# Patient Record
Sex: Female | Born: 1965 | Race: White | Hispanic: No | Marital: Married | Smoking: Current every day smoker
Health system: Southern US, Community
[De-identification: ages and names within clinical notes are randomized; demographics above are authoritative.]

## PROBLEM LIST (undated history)

## (undated) DIAGNOSIS — F32A Depression, unspecified: Secondary | ICD-10-CM

## (undated) DIAGNOSIS — E0500 Thyrotoxicosis with diffuse goiter without thyrotoxic crisis or storm: Secondary | ICD-10-CM

## (undated) DIAGNOSIS — M25852 Other specified joint disorders, left hip: Secondary | ICD-10-CM

## (undated) DIAGNOSIS — M25851 Other specified joint disorders, right hip: Secondary | ICD-10-CM

## (undated) DIAGNOSIS — I509 Heart failure, unspecified: Secondary | ICD-10-CM

## (undated) DIAGNOSIS — Z8719 Personal history of other diseases of the digestive system: Secondary | ICD-10-CM

## (undated) DIAGNOSIS — M1990 Unspecified osteoarthritis, unspecified site: Secondary | ICD-10-CM

## (undated) DIAGNOSIS — I10 Essential (primary) hypertension: Secondary | ICD-10-CM

## (undated) DIAGNOSIS — E785 Hyperlipidemia, unspecified: Secondary | ICD-10-CM

## (undated) DIAGNOSIS — E079 Disorder of thyroid, unspecified: Secondary | ICD-10-CM

## (undated) HISTORY — DX: Disorder of thyroid, unspecified: E07.9

## (undated) HISTORY — DX: Depression, unspecified: F32.A

## (undated) HISTORY — PX: UPPER GASTROINTESTINAL ENDOSCOPY: SHX188

## (undated) HISTORY — DX: Unspecified osteoarthritis, unspecified site: M19.90

## (undated) HISTORY — PX: APPENDECTOMY: SHX54

## (undated) HISTORY — DX: Hyperlipidemia, unspecified: E78.5

## (undated) HISTORY — PX: SPINAL FUSION: SHX223

## (undated) HISTORY — PX: CHOLECYSTECTOMY: SHX55

## (undated) HISTORY — PX: ROTATOR CUFF REPAIR: SHX139

## (undated) HISTORY — DX: Other specified joint disorders, right hip: M25.852

## (undated) HISTORY — PX: COLON SURGERY: SHX602

## (undated) HISTORY — PX: HIP SURGERY: SHX245

## (undated) HISTORY — PX: COLONOSCOPY: SHX174

## (undated) HISTORY — DX: Other specified joint disorders, right hip: M25.851

## (undated) HISTORY — DX: Personal history of other diseases of the digestive system: Z87.19

---

## 2020-05-27 ENCOUNTER — Ambulatory Visit: Payer: Self-pay | Admitting: Internal Medicine

## 2020-06-03 ENCOUNTER — Ambulatory Visit: Payer: Managed Care, Other (non HMO) | Admitting: Physician Assistant

## 2020-06-03 ENCOUNTER — Encounter: Payer: Self-pay | Admitting: Physician Assistant

## 2020-06-03 ENCOUNTER — Other Ambulatory Visit: Payer: Self-pay

## 2020-06-03 DIAGNOSIS — F411 Generalized anxiety disorder: Secondary | ICD-10-CM | POA: Diagnosis not present

## 2020-06-03 DIAGNOSIS — R030 Elevated blood-pressure reading, without diagnosis of hypertension: Secondary | ICD-10-CM

## 2020-06-03 DIAGNOSIS — E039 Hypothyroidism, unspecified: Secondary | ICD-10-CM | POA: Diagnosis not present

## 2020-06-03 DIAGNOSIS — Z7689 Persons encountering health services in other specified circumstances: Secondary | ICD-10-CM

## 2020-06-03 DIAGNOSIS — R5383 Other fatigue: Secondary | ICD-10-CM

## 2020-06-03 DIAGNOSIS — K257 Chronic gastric ulcer without hemorrhage or perforation: Secondary | ICD-10-CM

## 2020-06-03 DIAGNOSIS — M159 Polyosteoarthritis, unspecified: Secondary | ICD-10-CM

## 2020-06-03 NOTE — Progress Notes (Signed)
[facility] [place], [place] [address]  Internal MEDICINE  Office Visit Note  Patient Name: Deanna Howard  [id number]  [id number]  Date of Service: 06/11/2020   Complaints/HPI Pt is here for establishment of PCP. Chief Complaint  Patient presents with  . New Patient (Initial Visit)    Establish care   HPI Pt is here to establish care, she is on thyroid meds and needs refill and lab work. -She has not seen gyn for pap or mammo in 3 years. She wants to get back with obgyn due to her hx of abnormal paps and ablation in past and to have them take care of mammogram as well. -She mentions she has had wt gain in her mid section. Saw someone last Jan 2020 and did hormone pellets and started putting on wt. Pellets were testosterone. At this time estrogen level were normal. She had a dose in March 2020 and June 2020. She was told her testosterone was low and since she had chronic pain from arthritis and chronic fatigue they moved forward with treatment options and started the pellets along with progesterone and thyroid medication. She reports she actually lost wt overall, but put on fat in midsection. After the second pellet she stopped, and also stopped progesterone. She did continue the thyroid med still. -Moved from [place] in May 2020. 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 -Takes prozac 20mg  (1/2 tab daily). She lost her husband 8 yrs ago and had some anxiety after. Her son is deployed and occasional takes xanax once per month. She still has these meds. -She was started on armour thyroid in June 2020 and then didn't go back to that office again. She stopped for awhile because of wt gain and not feeling good. Started back on thyroid 1 month ago. Has not seen anyone or had her levels checked. -Carafate: Has an ulcer and can tell when it acts up and when she takes this for a few days it calms it  back down. It flares and is a problem every 3-4 weeks. Saw GI back home and did EGD/colonoscopy. EGD showed ulcer and colonscopy was normal. Neg for H pylori. Last saw GI in 2018. She avoids NSAIDs. Does take tylenol for chronic OA. Previously took celebrex but able to get off of it.  -No true menstrual cycle in 2 years. She did have break through bleeding when on progesterone, but has not had this 8 months. -No hx of high BP. Initially very elevated in office, improved somewhat on recheck to BP 158/86. She is able to check it multiple times per day and reports it is normally fine. She will continue to monitor and log daily.  Current Medication: Outpatient Encounter Medications as of 06/03/2020  Medication Sig  . ALPRAZolam (XANAX) 0.25 MG tablet Take 0.25 mg by mouth at bedtime as needed for anxiety. Takes as needed for anxiety  . FLUoxetine (PROZAC) 10 MG tablet Take 10 mg by mouth daily.  Marland Kitchen omeprazole (PRILOSEC) 20 MG capsule Take 20 mg by mouth daily.  . sucralfate (CARAFATE) 1 g tablet Take 1 g by mouth 4 (four) times daily. Takes 4 times daily when needed  . thyroid (ARMOUR) 15 MG tablet Take 45 mg by mouth daily. Takes 3 tablets daily  . TURMERIC PO Take 1,000 mg by mouth. Takes two to three a day   No facility-administered encounter medications on  file as of 06/03/2020.    Surgical History: Past Surgical History:  Procedure Laterality Date  . APPENDECTOMY    . CHOLECYSTECTOMY    . 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 HIP SURGERY     cartiledge repair, shaving of the bone.  Marland Kitchen ROTATOR CUFF REPAIR    . SPINAL FUSION      Medical History: Past Medical History:  Diagnosis Date  . Depression   . History of duodenal ulcer   . Hyperlipidemia   . Osteoarthritis   . Thyroid disease     Family History: Family History  Problem Relation Age of Onset  . Neuropathy Mother   . Hypertension Mother   . Heart disease Father   . Kidney disease Father   . Hypertension Father    . Heart attack Father   . COPD Father   . Hypertension Brother   . Heart disease Brother   . Heart disease Brother   . Hypertension Brother   . Heart disease Brother   . Hypertension Brother     Social History   Socioeconomic History  . Marital status: Single    Spouse name: Not on file  . Number of children: Not on file  . Years of education: Not on file  . Highest education level: Not on file  Occupational History  . Not on file  Tobacco Use  . Smoking status: Current Every Day Smoker    Types: Cigarettes  . Smokeless tobacco: Never Used  Substance and Sexual Activity  . Alcohol use: Not on file  . Drug use: Not on file  . Sexual activity: Not on file  Other Topics Concern  . Not on file  Social History Narrative  . Not on file   Social Determinants of Health   Financial Resource Strain: Not on file  Food Insecurity: Not on file  Transportation Needs: Not on file  Physical Activity: Not on file  Stress: Not on file  Social Connections: Not on file  Intimate Partner Violence: Not on file     Review of Systems  Constitutional: Positive for unexpected weight change. Negative for chills and fatigue.  HENT: Negative for congestion, postnasal drip, rhinorrhea, sneezing and sore throat.   Eyes: Negative for redness.  Respiratory: Negative for cough, chest tightness and shortness of breath.   Cardiovascular: Negative for chest pain and palpitations.  Gastrointestinal: Negative for abdominal pain, constipation, diarrhea, nausea and vomiting.  Genitourinary: Negative for dysuria and frequency.  Musculoskeletal: Positive for arthralgias. Negative for back pain, joint swelling and neck pain.  Skin: Negative for rash.  Neurological: Negative.  Negative for tremors and numbness.  Hematological: Negative for adenopathy. Does not bruise/bleed easily.  Psychiatric/Behavioral: Negative for behavioral problems (Depression), sleep disturbance and suicidal ideas. The patient  is nervous/anxious.     Vital Signs: BP (!) 170/90   Pulse 94   Temp 98.7 F (37.1 C)   Resp 16   Ht 5\' 5"  (1.651 m)   Wt 186 lb 9.6 oz (84.6 kg)   SpO2 97%   BMI 31.05 kg/m    Physical Exam Vitals and nursing note reviewed.  Constitutional:      General: She is not in acute distress.    Appearance: She is well-developed. She is obese. She is not diaphoretic.  HENT:     Head: Normocephalic and atraumatic.     Mouth/Throat:     Pharynx: No oropharyngeal exudate.  Eyes:  Pupils: Pupils are equal, round, and reactive to light.  Neck:     Thyroid: No thyromegaly.     Vascular: No JVD.     Trachea: No tracheal deviation.  Cardiovascular:     Rate and Rhythm: Normal rate and regular rhythm.     Heart sounds: Normal heart sounds. No murmur heard. No friction rub. No gallop.   Pulmonary:     Effort: Pulmonary effort is normal. No respiratory distress.     Breath sounds: No wheezing or rales.  Chest:     Chest wall: No tenderness.  Abdominal:     General: Bowel sounds are normal.     Palpations: Abdomen is soft.  Musculoskeletal:        General: Normal range of motion.     Cervical back: Normal range of motion and neck supple.  Lymphadenopathy:     Cervical: No cervical adenopathy.  Skin:    General: Skin is warm and dry.  Neurological:     Mental Status: She is alert and oriented to person, place, and time.     Cranial Nerves: No cranial nerve deficit.  Psychiatric:        Behavior: Behavior normal.        Thought Content: Thought content normal.        Judgment: Judgment normal.       Assessment/Plan: 1. Encounter to establish care with new doctor Will order routine fasting labs and hormone levels to be checked prior to physical. Pt will also be referred to OBGYN per her request and report of hx of abnormal pap and ablation. She will have them set her up for mammogram as well. She states she is up to date on colonoscopy done in 2018. - Ambulatory referral  to Obstetrics / Gynecology - CBC with Differential/Platelet; Future - Lipid Panel With LDL/HDL Ratio; Future - Comprehensive metabolic panel - [id number] - FSH/LH - Insulin, Free and Total - Lipid Panel With LDL/HDL Ratio - CBC with Differential/Platelet  2. Hypothyroidism, unspecified type Continue current medication. Will check labs and adjust as needed.  3. GAD (generalized anxiety disorder) Stable. Continue Prozac daily and only use xanax as needed--Per pt this is maybe once per month.  4. Elevated BP without diagnosis of hypertension BP improved, but was still elevated on recheck. Pt will monitor closely and bring log next visit. She will call the office sooner if she is consistently getting readings higher than 140/90 at home and understands that she will need to start on medication then if so. She is resistant to any BP meds at this time.  5. Generalized osteoarthritis of multiple sites Per pt she was able to get off of celebrex, and uses tumeric instead and tylenol as needed.  6. 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 Has occasional flares about monthly where she will take carafate as needed and this resolves. She continues to take prilosec daily. May need GI referral in future.   7. Other fatigue - Ambulatory referral to Obstetrics / Gynecology - CBC with Differential/Platelet; Future - Lipid Panel With LDL/HDL Ratio; Future - Comprehensive metabolic panel - [id number] - FSH/LH - Insulin, Free and Total - Lipid Panel With LDL/HDL Ratio - CBC with Differential/Platelet   General Counseling: Eladia verbalizes understanding of the findings of todays visit and agrees with plan of treatment. I have discussed any further diagnostic evaluation that may be needed or ordered today. We also reviewed her medications today. she  has been encouraged to call the office with any questions or concerns that should arise related to  todays visit.    Counseling:    Orders Placed This Encounter  Procedures  . CBC with Differential/Platelet  . Lipid Panel With LDL/HDL Ratio  . Comprehensive metabolic panel  . TSH+T4F+T3Free  . FSH/LH  . Insulin, Free and Total  . Ambulatory referral to Obstetrics / Gynecology    No orders of the defined types were placed in this encounter.    This patient was seen by Drema Dallas, PA-C in collaboration with Dr. Clayborn Bigness as a part of collaborative care agreement.   Time spent:40 Minutes

## 2020-06-11 ENCOUNTER — Encounter: Payer: Self-pay | Admitting: Physician Assistant

## 2020-07-17 LAB — LIPID PANEL WITH LDL/HDL RATIO
Cholesterol, Total: 265 mg/dL — ABNORMAL HIGH (ref 100–199)
HDL: 61 mg/dL (ref >39)
LDL Chol Calc (NIH): 185 mg/dL — ABNORMAL HIGH (ref 0–99)
LDL/HDL Ratio: 3 ratio (ref 0.0–3.2)
Triglycerides: 108 mg/dL (ref 0–149)
VLDL Cholesterol Cal: 19 mg/dL (ref 5–40)

## 2020-07-17 LAB — CBC WITH DIFFERENTIAL/PLATELET
Basophils Absolute: 0.1 10*3/uL (ref 0.0–0.2)
Basos: 1 %
EOS (ABSOLUTE): 0.2 10*3/uL (ref 0.0–0.4)
Eos: 2 %
Hematocrit: 43 % (ref 34.0–46.6)
Hemoglobin: 14.5 g/dL (ref 11.1–15.9)
Immature Grans (Abs): 0 10*3/uL (ref 0.0–0.1)
Immature Granulocytes: 0 %
Lymphocytes Absolute: 1.9 10*3/uL (ref 0.7–3.1)
Lymphs: 25 %
MCH: 30.5 pg (ref 26.6–33.0)
MCHC: 33.7 g/dL (ref 31.5–35.7)
MCV: 91 fL (ref 79–97)
Monocytes Absolute: 0.9 10*3/uL (ref 0.1–0.9)
Monocytes: 11 %
Neutrophils Absolute: 4.8 10*3/uL (ref 1.4–7.0)
Neutrophils: 61 %
Platelets: 238 10*3/uL (ref 150–450)
RBC: 4.75 x10E6/uL (ref 3.77–5.28)
RDW: 12.7 % (ref 11.7–15.4)
WBC: 7.8 10*3/uL (ref 3.4–10.8)

## 2020-07-18 ENCOUNTER — Other Ambulatory Visit: Payer: Self-pay

## 2020-07-18 ENCOUNTER — Ambulatory Visit (INDEPENDENT_AMBULATORY_CARE_PROVIDER_SITE_OTHER): Payer: 59 | Admitting: Physician Assistant

## 2020-07-18 ENCOUNTER — Encounter: Payer: Self-pay | Admitting: Physician Assistant

## 2020-07-18 DIAGNOSIS — Z0001 Encounter for general adult medical examination with abnormal findings: Secondary | ICD-10-CM

## 2020-07-18 DIAGNOSIS — R300 Dysuria: Secondary | ICD-10-CM

## 2020-07-18 DIAGNOSIS — E7800 Pure hypercholesterolemia, unspecified: Secondary | ICD-10-CM

## 2020-07-18 DIAGNOSIS — E039 Hypothyroidism, unspecified: Secondary | ICD-10-CM

## 2020-07-18 DIAGNOSIS — R030 Elevated blood-pressure reading, without diagnosis of hypertension: Secondary | ICD-10-CM | POA: Diagnosis not present

## 2020-07-18 DIAGNOSIS — F411 Generalized anxiety disorder: Secondary | ICD-10-CM

## 2020-07-18 DIAGNOSIS — M159 Polyosteoarthritis, unspecified: Secondary | ICD-10-CM

## 2020-07-18 NOTE — Patient Instructions (Signed)
High Cholesterol  High cholesterol is a condition in which the blood has high levels of a white, waxy substance similar to fat (cholesterol). The liver makes all the cholesterol that the body needs. The human body needs small amounts of cholesterol to help build cells. A person gets extra or excess cholesterol from the food that he or she eats. The blood carries cholesterol from the liver to the rest of the body. If you have high cholesterol, deposits (plaques) may build up on the walls of your arteries. Arteries are the blood vessels that carry blood away from your heart. These plaques make the arteries narrow and stiff. Cholesterol plaques increase your risk for heart attack and stroke. Work with your health care provider to keep your cholesterol levels in a healthy range. What increases the risk? The following factors may make you more likely to develop this condition:  Eating foods that are high in animal fat (saturated fat) or cholesterol.  Being overweight.  Not getting enough exercise.  A family history of high cholesterol (familial hypercholesterolemia).  Use of tobacco products.  Having diabetes. What are the signs or symptoms? There are no symptoms of this condition. How is this diagnosed? This condition may be diagnosed based on the results of a blood test.  If you are older than 57 years of age, your health care provider may check your cholesterol levels every 4-6 years.  You may be checked more often if you have high cholesterol or other risk factors for heart disease. The blood test for cholesterol measures:  "Bad" cholesterol, or LDL cholesterol. This is the main type of cholesterol that causes heart disease. The desired level is less than 100 mg/dL.  "Good" cholesterol, or HDL cholesterol. HDL helps protect against heart disease by cleaning the arteries and carrying the LDL to the liver for processing. The desired level for HDL is 60 mg/dL or higher.  Triglycerides.  These are fats that your body can store or burn for energy. The desired level is less than 150 mg/dL.  Total cholesterol. This measures the total amount of cholesterol in your blood and includes LDL, HDL, and triglycerides. The desired level is less than 200 mg/dL. How is this treated? This condition may be treated with:  Diet changes. You may be asked to eat foods that have more fiber and less saturated fats or added sugar.  Lifestyle changes. These may include regular exercise, maintaining a healthy weight, and quitting use of tobacco products.  Medicines. These are given when diet and lifestyle changes have not worked. You may be prescribed a statin medicine to help lower your cholesterol levels. Follow these instructions at home: Eating and drinking  Eat a healthy, balanced diet. This diet includes: ? Daily servings of a variety of fresh, frozen, or canned fruits and vegetables. ? Daily servings of whole grain foods that are rich in fiber. ? Foods that are low in saturated fats and trans fats. These include poultry and fish without skin, lean cuts of meat, and low-fat dairy products. ? A variety of fish, especially oily fish that contain omega-3 fatty acids. Aim to eat fish at least 2 times a week.  Avoid foods and drinks that have added sugar.  Use healthy cooking methods, such as roasting, grilling, broiling, baking, poaching, steaming, and stir-frying. Do not fry your food except for stir-frying.   Lifestyle  Get regular exercise. Aim to exercise for a total of 150 minutes a week. Increase your activity level by doing activities   such as gardening, walking, and taking the stairs.  Do not use any products that contain nicotine or tobacco, such as cigarettes, e-cigarettes, and chewing tobacco. If you need help quitting, ask your health care provider.   General instructions  Take over-the-counter and prescription medicines only as told by your health care provider.  Keep all  follow-up visits as told by your health care provider. This is important. Where to find more information  American Heart Association: www.heart.org  National Heart, Lung, and Blood Institute: www.nhlbi.nih.gov Contact a health care provider if:  You have trouble achieving or maintaining a healthy diet or weight.  You are starting an exercise program.  You are unable to stop smoking. Get help right away if:  You have chest pain.  You have trouble breathing.  You have any symptoms of a stroke. "BE FAST" is an easy way to remember the main warning signs of a stroke: ? B - Balance. Signs are dizziness, sudden trouble walking, or loss of balance. ? E - Eyes. Signs are trouble seeing or a sudden change in vision. ? F - Face. Signs are sudden weakness or numbness of the face, or the face or eyelid drooping on one side. ? A - Arms. Signs are weakness or numbness in an arm. This happens suddenly and usually on one side of the body. ? S - Speech. Signs are sudden trouble speaking, slurred speech, or trouble understanding what people say. ? T - Time. Time to call emergency services. Write down what time symptoms started.  You have other signs of a stroke, such as: ? A sudden, severe headache with no known cause. ? Nausea or vomiting. ? Seizure. These symptoms may represent a serious problem that is an emergency. Do not wait to see if the symptoms will go away. Get medical help right away. Call your local emergency services (911 in the [place] Do not drive yourself to the hospital. Summary  Cholesterol plaques increase your risk for heart attack and stroke. Work with your health care provider to keep your cholesterol levels in a healthy range.  Eat a healthy, balanced diet, get regular exercise, and maintain a healthy weight.  Do not use any products that contain nicotine or tobacco, such as cigarettes, e-cigarettes, and chewing tobacco.  Get help right away if you have any symptoms of a  stroke. This information is not intended to replace advice given to you by your health care provider. Make sure you discuss any questions you have with your health care provider. Document Revised: 02/13/2019 Document Reviewed: 02/13/2019 Elsevier Patient Education  2021 [employer]  

## 2020-07-18 NOTE — Progress Notes (Signed)
[facility] [place], [place] [address]  Internal MEDICINE  Office Visit Note  Patient Name: Deanna Howard  [id number]  [id number]  Date of Service: 07/18/2020  Chief Complaint  Patient presents with  . Annual Exam  . Depression  . Hyperlipidemia     HPI Pt is here for routine health maintenance examination -OBGYN appt in May and will have them do her pap and order mammogram.  -UTD on colonoscopy -BP at home 140/70-80s at the highest; is changing her diet now that she is working from home. She had been doing low carb diet, but it was high in eggs, bacon and sodium content. Discussed her cholesterol was very high and she should be placed on a statin. She is against starting any BP meds or a statin right now and wants to change diet/exercise firtst and re check lipids in 6 months. She will monitoring BP closely and bring a log next visit. She does get a little anxious in office and may be why BP high in office. Recheck 154/84 -takes xanax only as needed has not taken it in over a month. -Prozac keeps her stable and mentions her son is back from deployment and is helping. -For some reason not all of her labs were drawn, so she will be sent a lab slip for any remaining labs that need to be done prior to next visit  Current Medication: Outpatient Encounter Medications as of 07/18/2020  Medication Sig  . ALPRAZolam (XANAX) 0.25 MG tablet Take 0.25 mg by mouth at bedtime as needed for anxiety. Takes as needed for anxiety  . FLUoxetine (PROZAC) 10 MG tablet Take 10 mg by mouth daily.  Marland Kitchen omeprazole (PRILOSEC) 20 MG capsule Take 20 mg by mouth daily.  . sucralfate (CARAFATE) 1 g tablet Take 1 g by mouth 4 (four) times daily. Takes 4 times daily when needed  . thyroid (ARMOUR) 15 MG tablet Take 45 mg by mouth daily. Takes 3 tablets daily  . TURMERIC PO Take 1,000 mg by mouth. Takes two to three a day   No facility-administered encounter medications on file as of  07/18/2020.    Surgical History: Past Surgical History:  Procedure Laterality Date  . APPENDECTOMY    . CHOLECYSTECTOMY    . 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 HIP SURGERY     cartiledge repair, shaving of the bone.  Marland Kitchen ROTATOR CUFF REPAIR    . SPINAL FUSION      Medical History: Past Medical History:  Diagnosis Date  . Depression   . History of duodenal ulcer   . Hyperlipidemia   . Osteoarthritis   . Thyroid disease     Family History: Family History  Problem Relation Age of Onset  . Neuropathy Mother   . Hypertension Mother   . Heart disease Father   . Kidney disease Father   . Hypertension Father   . Heart attack Father   . COPD Father   . Hypertension Brother   . Heart disease Brother   . Heart disease Brother   . Hypertension Brother   . Heart disease Brother   . Hypertension Brother       Review of Systems  Constitutional: Negative for chills, fatigue and unexpected weight change.  HENT: Negative for congestion, postnasal drip, rhinorrhea, sneezing and sore throat.   Eyes: Negative for redness.  Respiratory: Negative for cough, chest tightness and shortness of breath.   Cardiovascular: Negative for  chest pain and palpitations.  Gastrointestinal: Negative for abdominal pain, constipation, diarrhea, nausea and vomiting.  Genitourinary: Negative for dysuria and frequency.  Musculoskeletal: Negative for arthralgias, back pain, joint swelling and neck pain.  Skin: Negative for rash.  Neurological: Negative.  Negative for tremors and numbness.  Hematological: Negative for adenopathy. Does not bruise/bleed easily.  Psychiatric/Behavioral: Negative for behavioral problems (Depression), sleep disturbance and suicidal ideas. The patient is nervous/anxious.      Vital Signs: BP (!) 160/90   Pulse 82   Temp 97.9 F (36.6 C)   Resp 16   Ht 5\' 5"  (1.651 m)   Wt 185 lb (83.9 kg)   SpO2 99%   BMI 30.79 kg/m    Physical Exam Vitals and nursing  note reviewed.  Constitutional:      General: She is not in acute distress.    Appearance: She is well-developed. She is obese. She is not diaphoretic.  HENT:     Head: Normocephalic and atraumatic.     Right Ear: External ear normal.     Left Ear: External ear normal.     Nose: Nose normal.     Mouth/Throat:     Pharynx: No oropharyngeal exudate.  Eyes:     General: No scleral icterus.       Right eye: No discharge.        Left eye: No discharge.     Conjunctiva/sclera: Conjunctivae normal.     Pupils: Pupils are equal, round, and reactive to light.  Neck:     Thyroid: No thyromegaly.     Vascular: No JVD.     Trachea: No tracheal deviation.  Cardiovascular:     Rate and Rhythm: Normal rate and regular rhythm.     Heart sounds: Normal heart sounds. No murmur heard. No friction rub. No gallop.   Pulmonary:     Effort: Pulmonary effort is normal. No respiratory distress.     Breath sounds: Normal breath sounds. No stridor. No wheezing or rales.  Chest:     Chest wall: No tenderness.  Breasts:     Right: Normal. No mass.     Left: Normal. No mass.    Abdominal:     General: Bowel sounds are normal. There is no distension.     Palpations: Abdomen is soft. There is no mass.     Tenderness: There is no abdominal tenderness. There is no guarding or rebound.  Musculoskeletal:        General: No tenderness or deformity. Normal range of motion.     Cervical back: Normal range of motion and neck supple.  Lymphadenopathy:     Cervical: No cervical adenopathy.  Skin:    General: Skin is warm and dry.     Coloration: Skin is not pale.     Findings: No erythema or rash.  Neurological:     Mental Status: She is alert and oriented to person, place, and time.     Cranial Nerves: No cranial nerve deficit.     Motor: No abnormal muscle tone.     Coordination: Coordination normal.     Deep Tendon Reflexes: Reflexes are normal and symmetric.  Psychiatric:        Behavior: Behavior  normal.        Thought Content: Thought content normal.        Judgment: Judgment normal.      LABS: Recent Results (from the past 2160 hour(s))  Lipid Panel With LDL/HDL Ratio  Status: Abnormal   Collection Time: 07/16/20  7:15 AM  Result Value Ref Range   Cholesterol, Total 265 (H) 100 - 199 mg/dL   Triglycerides 108 0 - 149 mg/dL   HDL 61 >39 mg/dL   VLDL Cholesterol Cal 19 5 - 40 mg/dL   LDL Chol Calc (NIH) 185 (H) 0 - 99 mg/dL   LDL/HDL Ratio 3.0 0.0 - 3.2 ratio    Comment:                                     LDL/HDL Ratio                                             Men  Women                               1/2 Avg.Risk  1.0    1.5                                   Avg.Risk  3.6    3.2                                2X Avg.Risk  6.2    5.0                                3X Avg.Risk  8.0    6.1   CBC with Differential/Platelet     Status: None   Collection Time: 07/16/20  7:16 AM  Result Value Ref Range   WBC 7.8 3.4 - 10.8 x10E3/uL   RBC 4.75 3.77 - 5.28 x10E6/uL   Hemoglobin 14.5 11.1 - 15.9 g/dL   Hematocrit 43.0 34.0 - 46.6 %   MCV 91 79 - 97 fL   MCH 30.5 26.6 - 33.0 pg   MCHC 33.7 31.5 - 35.7 g/dL   RDW 12.7 11.7 - 15.4 %   Platelets 238 150 - 450 x10E3/uL   Neutrophils 61 Not Estab. %   Lymphs 25 Not Estab. %   Monocytes 11 Not Estab. %   Eos 2 Not Estab. %   Basos 1 Not Estab. %   Neutrophils Absolute 4.8 1.4 - 7.0 x10E3/uL   Lymphocytes Absolute 1.9 0.7 - 3.1 x10E3/uL   Monocytes Absolute 0.9 0.1 - 0.9 x10E3/uL   EOS (ABSOLUTE) 0.2 0.0 - 0.4 x10E3/uL   Basophils Absolute 0.1 0.0 - 0.2 x10E3/uL   Immature Granulocytes 0 Not Estab. %   Immature Grans (Abs) 0.0 0.0 - 0.1 x10E3/uL        Assessment/Plan: 1. Encounter for general adult medical examination with abnormal findings Pt had some of her labs drawn and will be mailed a slip for remaining labs. She will have pap and mammogram done via OBGYN in May. UTD on colonoscopy  2. Hypothyroidism,  unspecified type Pending labs, continue armour thyroid and adjust as indicated  3. GAD (generalized anxiety disorder) Stable, Continue Prozac  4. Elevated BP without diagnosis of hypertension BP improved some on recheck. Per pt never over 140/80  at home. She will log BP at home for next visit. She is also working on diet/exercise.  5. Pure hypercholesterolemia Pt declined statin at this time, making effort to change diet and increase exercise.  6. Generalized osteoarthritis of multiple sites May take tylenol as needed.  7. Dysuria - UA/M w/rflx Culture, Routine   General Counseling: Deanna Howard verbalizes understanding of the findings of todays visit and agrees with plan of treatment. I have discussed any further diagnostic evaluation that may be needed or ordered today. We also reviewed her medications today. she has been encouraged to call the office with any questions or concerns that should arise related to todays visit.    Counseling:    Orders Placed This Encounter  Procedures  . UA/M w/rflx Culture, Routine    No orders of the defined types were placed in this encounter.   This patient was seen by Drema Dallas, PA-C in collaboration with Dr. Clayborn Bigness as a part of collaborative care agreement.  Total time spent:30 Minutes  Time spent includes review of chart, medications, test results, and follow up plan with the patient.     Lavera Guise, MD  Internal Medicine

## 2020-07-19 LAB — TSH+T4F+T3FREE
Free T4: 1.11 ng/dL (ref 0.82–1.77)
T3, Free: 3.1 pg/mL (ref 2.0–4.4)
TSH: 1 u[IU]/mL (ref 0.450–4.500)

## 2020-07-19 LAB — SPECIMEN STATUS REPORT

## 2020-07-21 LAB — MICROSCOPIC EXAMINATION
Casts: NONE SEEN /lpf
Epithelial Cells (non renal): >10 /hpf — AB (ref 0–10)
RBC, Urine: NONE SEEN /hpf (ref 0–2)

## 2020-07-21 LAB — UA/M W/RFLX CULTURE, ROUTINE
Bilirubin, UA: NEGATIVE
Glucose, UA: NEGATIVE
Ketones, UA: NEGATIVE
Nitrite, UA: NEGATIVE
Protein,UA: NEGATIVE
RBC, UA: NEGATIVE
Specific Gravity, UA: 1.009 (ref 1.005–1.030)
Urobilinogen, Ur: 0.2 mg/dL (ref 0.2–1.0)
pH, UA: 7 (ref 5.0–7.5)

## 2020-07-21 LAB — URINE CULTURE, REFLEX

## 2020-07-26 ENCOUNTER — Encounter: Payer: Self-pay | Admitting: Internal Medicine

## 2020-07-26 ENCOUNTER — Other Ambulatory Visit: Payer: Self-pay | Admitting: Physician Assistant

## 2020-07-26 DIAGNOSIS — R5383 Other fatigue: Secondary | ICD-10-CM

## 2020-08-11 LAB — COMPREHENSIVE METABOLIC PANEL
ALT: 25 IU/L (ref 0–32)
AST: 17 IU/L (ref 0–40)
Albumin/Globulin Ratio: 1.9 (ref 1.2–2.2)
Albumin: 4.4 g/dL (ref 3.8–4.9)
Alkaline Phosphatase: 83 IU/L (ref 44–121)
BUN/Creatinine Ratio: 18 (ref 9–23)
BUN: 14 mg/dL (ref 6–24)
Bilirubin Total: 0.3 mg/dL (ref 0.0–1.2)
CO2: 23 mmol/L (ref 20–29)
Calcium: 9.4 mg/dL (ref 8.7–10.2)
Chloride: 100 mmol/L (ref 96–106)
Creatinine, Ser: 0.77 mg/dL (ref 0.57–1.00)
Globulin, Total: 2.3 g/dL (ref 1.5–4.5)
Glucose: 96 mg/dL (ref 65–99)
Potassium: 3.7 mmol/L (ref 3.5–5.2)
Sodium: 138 mmol/L (ref 134–144)
Total Protein: 6.7 g/dL (ref 6.0–8.5)
eGFR: 90 mL/min/{1.73_m2} (ref >59)

## 2020-08-11 LAB — FSH/LH
FSH: 63.9 m[IU]/mL
LH: 47.7 m[IU]/mL

## 2020-08-11 LAB — INSULIN, FREE AND TOTAL
Free Insulin: 8.2 uU/mL
Total Insulin: 8.2 uU/mL

## 2020-08-15 ENCOUNTER — Encounter: Payer: Self-pay | Admitting: Physician Assistant

## 2020-08-15 ENCOUNTER — Other Ambulatory Visit: Payer: Self-pay | Admitting: Physician Assistant

## 2020-08-15 DIAGNOSIS — I10 Essential (primary) hypertension: Secondary | ICD-10-CM

## 2020-08-15 MED ORDER — LOSARTAN POTASSIUM 25 MG PO TABS: 25.0000 mg | ORAL_TABLET | Freq: Every day | ORAL | 0 refills | Status: DC

## 2020-08-15 NOTE — Telephone Encounter (Signed)
Try losartan 25mg  po qd

## 2020-08-21 ENCOUNTER — Ambulatory Visit (INDEPENDENT_AMBULATORY_CARE_PROVIDER_SITE_OTHER): Payer: 59 | Admitting: Certified Nurse Midwife

## 2020-08-21 ENCOUNTER — Encounter: Payer: Self-pay | Admitting: Certified Nurse Midwife

## 2020-08-21 ENCOUNTER — Other Ambulatory Visit: Payer: Self-pay

## 2020-08-21 ENCOUNTER — Other Ambulatory Visit (HOSPITAL_COMMUNITY)
Admission: RE | Admit: 2020-08-21 | Discharge: 2020-08-21 | Disposition: A | Discharge status: Home / Self Care | Payer: 59 | Source: Ambulatory Visit | Attending: Certified Nurse Midwife | Admitting: Certified Nurse Midwife

## 2020-08-21 VITALS — BP 172/99 | HR 76 | Resp 16 | Ht 65.0 in | Wt 188.6 lb

## 2020-08-21 DIAGNOSIS — Z124 Encounter for screening for malignant neoplasm of cervix: Secondary | ICD-10-CM | POA: Diagnosis not present

## 2020-08-21 DIAGNOSIS — Z1231 Encounter for screening mammogram for malignant neoplasm of breast: Secondary | ICD-10-CM

## 2020-08-21 DIAGNOSIS — R140 Abdominal distension (gaseous): Secondary | ICD-10-CM | POA: Diagnosis not present

## 2020-08-21 DIAGNOSIS — Z01419 Encounter for gynecological examination (general) (routine) without abnormal findings: Secondary | ICD-10-CM

## 2020-08-21 DIAGNOSIS — Z1159 Encounter for screening for other viral diseases: Secondary | ICD-10-CM

## 2020-08-21 DIAGNOSIS — Z23 Encounter for immunization: Secondary | ICD-10-CM

## 2020-08-21 DIAGNOSIS — Z114 Encounter for screening for human immunodeficiency virus [HIV]: Secondary | ICD-10-CM

## 2020-08-21 NOTE — Progress Notes (Signed)
GYNECOLOGY ANNUAL PREVENTATIVE CARE ENCOUNTER NOTE  History:     Deanna Howard is a 54 y.o. G6P2002 female here for a routine annual gynecologic exam.  Current complaints: Abdominal bloating that makes her very uncomfortable. State it does not mater what she eats.   Denies abnormal vaginal bleeding, discharge, pelvic pain, problems with intercourse or other gynecologic concerns.     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 Patient is postmenopausal. Contraception: tubal ligation Last Pap: 2019 Results were: normal  Last mammogram: 2019. Results were: normal  Obstetric History OB History  Gravida Para Term Preterm AB Living  2 2 2  0 0 2  SAB IAB Ectopic Multiple Live Births  0 0 0 0 0    # Outcome Date GA Lbr Len/2nd Weight Sex Delivery Anes PTL Lv  2 Term           1 Term             Past Medical History:  Diagnosis Date  . Depression   . History of duodenal ulcer   . Hyperlipidemia   . Osteoarthritis   . Thyroid disease     Past Surgical History:  Procedure Laterality Date  . APPENDECTOMY    . CHOLECYSTECTOMY    . 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 HIP SURGERY     cartiledge repair, shaving of the bone.  Marland Kitchen ROTATOR CUFF REPAIR    . SPINAL FUSION      Current Outpatient Medications on File Prior to Visit  Medication Sig Dispense Refill  . ALPRAZolam (XANAX) 0.25 MG tablet Take 0.25 mg by mouth at bedtime as needed for anxiety. Takes as needed for anxiety    . FLUoxetine (PROZAC) 10 MG tablet Take 10 mg by mouth daily.    Marland Kitchen losartan (COZAAR) 25 MG tablet Take 1 tablet (25 mg total) by mouth daily. 90 tablet 0  . omeprazole (PRILOSEC) 20 MG capsule Take 20 mg by mouth daily.    . sucralfate  (CARAFATE) 1 g tablet Take 1 g by mouth 4 (four) times daily. Takes 4 times daily when needed    . thyroid (ARMOUR) 15 MG tablet Take 45 mg by mouth daily. Takes 3 tablets daily    . TURMERIC PO Take 1,000 mg by mouth. Takes two to three a day     No current facility-administered medications on file prior to visit.    No Known Allergies  Social History:  reports that she has been smoking cigarettes. She has been smoking about 1.00 pack per day. She has never used smokeless tobacco.  Family History  Problem Relation Age of Onset  . Neuropathy Mother   . Hypertension Mother   . Heart disease Father   . Kidney disease Father   . Hypertension Father   . Heart attack Father   . COPD Father   . Hypertension Brother   . Heart disease Brother   . Heart disease Brother   . Hypertension Brother   . Heart disease Brother   . Hypertension Brother     The following portions of the patient's history were reviewed and updated as appropriate: allergies, current medications, past family history, past medical history, past social history, past  surgical history and problem list.  Review of Systems Pertinent items noted in HPI and remainder of comprehensive ROS otherwise negative.  Physical Exam:  BP (!) 172/99   Pulse 76   Resp 16   Ht 5\' 5"  (1.651 m)   Wt 188 lb 9.6 oz (85.5 kg)   BMI 31.38 kg/m  CONSTITUTIONAL: Well-developed, well-nourished female in no acute distress.  HENT:  Normocephalic, atraumatic, External right and left ear normal. Oropharynx is clear and moist EYES: Conjunctivae and EOM are normal. Pupils are equal, round, and reactive to light. No scleral icterus.  NECK: Normal range of motion, supple, no masses.  Normal thyroid.  SKIN: Skin is warm and dry. No rash noted. Not diaphoretic. No erythema. No pallor. MUSCULOSKELETAL: Normal range of motion. No tenderness.  No cyanosis, clubbing, or edema.  2+ distal pulses. NEUROLOGIC: Alert and oriented to person, place, and  time. Normal reflexes, muscle tone coordination.  PSYCHIATRIC: Normal mood and affect. Normal behavior. Normal judgment and thought content. CARDIOVASCULAR: Normal heart rate noted, regular rhythm RESPIRATORY: Clear to auscultation bilaterally. Effort and breath sounds normal, no problems with respiration noted. BREASTS: Symmetric in size. No masses, tenderness, skin changes, nipple drainage, or lymphadenopathy bilaterally.  ABDOMEN: Soft, no distention noted.  No tenderness, rebound or guarding.  PELVIC: Normal appearing external genitalia and urethral meatus; normal appearing vaginal mucosa and cervix.  No abnormal discharge noted.  Pap smear obtained.  Normal uterine size, no other palpable masses, no uterine or adnexal tenderness.  .   Assessment and Plan:    Well Women GYN exam    Pap:Will follow up results of pap smear and manage accordingly. Mammogram :ordered Labs: declines  Refills: none Referral: GI  Routine preventative health maintenance measures emphasized. Please refer to After Visit Summary for other counseling recommendations.      Philip Aspen, CNM [facility]

## 2020-08-21 NOTE — Patient Instructions (Signed)
Preventive Care 57-57 Years Old, Female Preventive care refers to lifestyle choices and visits with your health care provider that can promote health and wellness. This includes:  A yearly physical exam. This is also called an annual wellness visit.  Regular dental and eye exams.  Immunizations.  Screening for certain conditions.  Healthy lifestyle choices, such as: ? Eating a healthy diet. ? Getting regular exercise. ? Not using drugs or products that contain nicotine and tobacco. ? Limiting alcohol use. What can I expect for my preventive care visit? Physical exam Your health care provider will check your:  Height and weight. These may be used to calculate your BMI (body mass index). BMI is a measurement that tells if you are at a healthy weight.  Heart rate and blood pressure.  Body temperature.  Skin for abnormal spots. Counseling Your health care provider may ask you questions about your:  Past medical problems.  Family's medical history.  Alcohol, tobacco, and drug use.  Emotional well-being.  Home life and relationship well-being.  Sexual activity.  Diet, exercise, and sleep habits.  Work and work Statistician.  Access to firearms.  Method of birth control.  Menstrual cycle.  Pregnancy history. What immunizations do I need? Vaccines are usually given at various ages, according to a schedule. Your health care provider will recommend vaccines for you based on your age, medical history, and lifestyle or other factors, such as travel or where you work.   What tests do I need? Blood tests  Lipid and cholesterol levels. These may be checked every 5 years, or more often if you are over 57 years old.  Hepatitis C test.  Hepatitis B test. Screening  Lung cancer screening. You may have this screening every year starting at age 57 if you have a 30-pack-year history of smoking and currently smoke or have quit within the past 15 years.  Colorectal cancer  screening. ? All adults should have this screening starting at age 57 and continuing until age 17. and continuing until age 17. ? Your health care provider may recommend screening at age 57 if you are at increased risk. ? You will have tests every 1-10 years, depending on your results and the type of screening test.  Diabetes screening. ? This is done by checking your blood sugar (glucose) after you have not eaten for a while (fasting). ? You may have this done every 1-3 years.  Mammogram. ? This may be done every 1-2 years. ? Talk with your health care provider about when you should start having regular mammograms. This may depend on whether you have a family history of breast cancer.  BRCA-related cancer screening. This may be done if you have a family history of breast, ovarian, tubal, or peritoneal cancers.  Pelvic exam and Pap test. ? This may be done every 3 years starting at age 57. ? Starting at age 11, this may be done every 5 years if you have a Pap test in combination with an HPV test. Other tests  STD (sexually transmitted disease) testing, if you are at risk.  Bone density scan. This is done to screen for osteoporosis. You may have this scan if you are at high risk for osteoporosis. Talk with your health care provider about your test results, treatment options, and if necessary, the need for more tests. Follow these instructions at home: Eating and drinking  Eat a diet that includes fresh fruits and vegetables, whole grains, lean protein, and low-fat dairy products.  Take vitamin and mineral supplements  as recommended by your health care provider.  Do not drink alcohol if: ? Your health care provider tells you not to drink. ? You are pregnant, may be pregnant, or are planning to become pregnant.  If you drink alcohol: ? Limit how much you have to 0-1 drink a day. ? Be aware of how much alcohol is in your drink. In the [place] one drink equals one 12 oz bottle of beer (355 mL), one 5 oz glass of  wine (148 mL), or one 1 oz glass of hard liquor (44 mL).   Lifestyle  Take daily care of your teeth and gums. Brush your teeth every morning and night with fluoride toothpaste. Floss one time each day.  Stay active. Exercise for at least 30 minutes 5 or more days each week.  Do not use any products that contain nicotine or tobacco, such as cigarettes, e-cigarettes, and chewing tobacco. If you need help quitting, ask your health care provider.  Do not use drugs.  If you are sexually active, practice safe sex. Use a condom or other form of protection to prevent STIs (sexually transmitted infections).  If you do not wish to become pregnant, use a form of birth control. If you plan to become pregnant, see your health care provider for a prepregnancy visit.  If told by your health care provider, take low-dose aspirin daily starting at age 50.  Find healthy ways to cope with stress, such as: ? Meditation, yoga, or listening to music. ? Journaling. ? Talking to a trusted person. ? Spending time with friends and family. Safety  Always wear your seat belt while driving or riding in a vehicle.  Do not drive: ? If you have been drinking alcohol. Do not ride with someone who has been drinking. ? When you are tired or distracted. ? While texting.  Wear a helmet and other protective equipment during sports activities.  If you have firearms in your house, make sure you follow all gun safety procedures. What's next?  Visit your health care provider once a year for an annual wellness visit.  Ask your health care provider how often you should have your eyes and teeth checked.  Stay up to date on all vaccines. This information is not intended to replace advice given to you by your health care provider. Make sure you discuss any questions you have with your health care provider. Document Revised: 12/19/2019 Document Reviewed: 11/25/2017 Elsevier Patient Education  2021 Elsevier Inc.  

## 2020-08-22 ENCOUNTER — Encounter: Payer: Self-pay | Admitting: *Deleted

## 2020-08-23 LAB — CYTOLOGY - PAP
Comment: NEGATIVE
Diagnosis: NEGATIVE
High risk HPV: NEGATIVE

## 2020-08-27 ENCOUNTER — Other Ambulatory Visit: Payer: Self-pay | Admitting: Certified Nurse Midwife

## 2020-08-27 MED ORDER — METRONIDAZOLE 500 MG PO TABS: 500.0000 mg | ORAL_TABLET | Freq: Two times a day (BID) | ORAL | 0 refills | Status: AC

## 2020-10-07 ENCOUNTER — Other Ambulatory Visit: Payer: Self-pay

## 2020-10-07 ENCOUNTER — Telehealth: Payer: Self-pay

## 2020-10-07 ENCOUNTER — Encounter: Payer: Self-pay | Admitting: Physician Assistant

## 2020-10-07 DIAGNOSIS — R030 Elevated blood-pressure reading, without diagnosis of hypertension: Secondary | ICD-10-CM

## 2020-10-07 DIAGNOSIS — Z7689 Persons encountering health services in other specified circumstances: Secondary | ICD-10-CM

## 2020-10-07 DIAGNOSIS — F411 Generalized anxiety disorder: Secondary | ICD-10-CM

## 2020-10-07 DIAGNOSIS — R5383 Other fatigue: Secondary | ICD-10-CM

## 2020-10-07 DIAGNOSIS — E039 Hypothyroidism, unspecified: Secondary | ICD-10-CM

## 2020-10-07 DIAGNOSIS — M159 Polyosteoarthritis, unspecified: Secondary | ICD-10-CM

## 2020-10-07 DIAGNOSIS — K257 Chronic gastric ulcer without hemorrhage or perforation: Secondary | ICD-10-CM

## 2020-10-07 DIAGNOSIS — I10 Essential (primary) hypertension: Secondary | ICD-10-CM

## 2020-10-07 MED ORDER — THYROID 15 MG PO TABS: 45.0000 mg | ORAL_TABLET | Freq: Every day | ORAL | 1 refills | Status: DC

## 2020-10-07 MED ORDER — LOSARTAN POTASSIUM 25 MG PO TABS: 25.0000 mg | ORAL_TABLET | Freq: Every day | ORAL | 0 refills | Status: DC

## 2020-10-07 MED ORDER — SULFAMETHOXAZOLE-TRIMETHOPRIM 800-160 MG PO TABS
1.0000 | ORAL_TABLET | Freq: Two times a day (BID) | ORAL | 0 refills | Status: DC
Start: 2020-10-07 — End: 2020-10-07

## 2020-10-07 MED ORDER — SULFAMETHOXAZOLE-TRIMETHOPRIM 800-160 MG PO TABS: 1.0000 | ORAL_TABLET | Freq: Two times a day (BID) | ORAL | 0 refills | Status: DC

## 2020-10-07 NOTE — Telephone Encounter (Signed)
Pt called that had UTI symptoms unable to come in due to new jobs as per lauren send her bactrim also refill her thyroid med confirm she is taking armour thyroid and also send losartan  and advised her keep follow up

## 2020-10-07 NOTE — Telephone Encounter (Signed)
Please review

## 2020-10-07 NOTE — Telephone Encounter (Signed)
Pt sent message thru my chart stating she may have a UTI.  Per DFK we sent in Bactrim 800 to her pharmacy.  Pt was notified and informed to keep her f-up appt.  Pt stated she would keep her appt but she has started a new position at her job and may have to change to a different date.

## 2020-10-17 ENCOUNTER — Encounter: Payer: Self-pay | Admitting: Physician Assistant

## 2020-10-17 ENCOUNTER — Ambulatory Visit (INDEPENDENT_AMBULATORY_CARE_PROVIDER_SITE_OTHER): Payer: 59 | Admitting: Physician Assistant

## 2020-10-17 ENCOUNTER — Other Ambulatory Visit: Payer: Self-pay

## 2020-10-17 DIAGNOSIS — E039 Hypothyroidism, unspecified: Secondary | ICD-10-CM

## 2020-10-17 DIAGNOSIS — E7800 Pure hypercholesterolemia, unspecified: Secondary | ICD-10-CM

## 2020-10-17 DIAGNOSIS — R300 Dysuria: Secondary | ICD-10-CM

## 2020-10-17 DIAGNOSIS — F411 Generalized anxiety disorder: Secondary | ICD-10-CM

## 2020-10-17 DIAGNOSIS — I10 Essential (primary) hypertension: Secondary | ICD-10-CM | POA: Diagnosis not present

## 2020-10-17 LAB — POCT URINALYSIS DIPSTICK
Bilirubin, UA: NEGATIVE
Glucose, UA: NEGATIVE
Ketones, UA: NEGATIVE
Leukocytes, UA: NEGATIVE
Nitrite, UA: NEGATIVE
Protein, UA: POSITIVE — AB
Spec Grav, UA: 1.01 (ref 1.010–1.025)
Urobilinogen, UA: 0.2 E.U./dL
pH, UA: 6.5 (ref 5.0–8.0)

## 2020-10-17 NOTE — Progress Notes (Signed)
[facility] [place], [place] [address]  Internal MEDICINE  Office Visit Note  Patient Name: Deanna Howard  [id number]  [id number]  Date of Service: 10/20/2020  Chief Complaint  Patient presents with   Follow-up    Discuss meds, recheck urine for UTI   Depression   Hyperlipidemia    HPI Pt is here for routine follow up -BP at home 140s/70-80, no longer having headaches or irritability since starting losartan, but still not fully controlled. Especially based on office reading. Discussed doubling to 50mg  losartan and monitoring BP closely. Will re-evaluate in 4 weeks. -Snores, does not gasp. Reports previous sleep study maybe 6 years ago that did not show OSA. Weight has changed-in the last year has put on about 15lbs due to GI issues. But GI issues have improved and is now going to work on wt loss since she is feeling better. Wants to hold off on new PSG for now as she works to lose weight and get BP under control, but willing to discuss again next visit if BP still problematic as uncontrolled bp can be a result of untreated OSA. -No longer having any UTI symptoms. Did a dip in office--some blood in urine--will repeat next visit for blood and check renal US if not improving  Current Medication: Outpatient Encounter Medications as of 10/17/2020  Medication Sig   ALPRAZolam (XANAX) 0.25 MG tablet Take 0.25 mg by mouth at bedtime as needed for anxiety. Takes as needed for anxiety   FLUoxetine (PROZAC) 10 MG tablet Take 10 mg by mouth daily.   losartan (COZAAR) 25 MG tablet Take 1 tablet (25 mg total) by mouth daily.   omeprazole (PRILOSEC) 20 MG capsule Take 20 mg by mouth daily.   sucralfate (CARAFATE) 1 g tablet Take 1 g by mouth 4 (four) times daily. Takes 4 times daily when needed   thyroid (ARMOUR) 15 MG tablet Take 3 tablets (45 mg total) by mouth daily.   TURMERIC PO Take 1,000 mg by mouth. Takes two to three a day   [DISCONTINUED]  sulfamethoxazole-trimethoprim (BACTRIM DS) 800-160 MG tablet Take 1 tablet by mouth 2 (two) times daily. For 5 days (Patient not taking: Reported on 10/17/2020)   No facility-administered encounter medications on file as of 10/17/2020.    Surgical History: Past Surgical History:  Procedure Laterality Date   APPENDECTOMY     CHOLECYSTECTOMY     COLON SURGERY     removed tumor from colon   HIP SURGERY     cartiledge repair, shaving of the bone.   ROTATOR CUFF REPAIR     SPINAL FUSION      Medical History: Past Medical History:  Diagnosis Date   Depression    Femoroacetabular impingement of both hips    History of duodenal ulcer    Hyperlipidemia    Osteoarthritis    Thyroid disease     Family History: Family History  Problem Relation Age of Onset   Neuropathy Mother    Hypertension Mother    Heart disease Father    Kidney disease Father    Hypertension Father    Heart attack Father    COPD Father    Hypertension Brother    Heart disease Brother    Heart disease Brother    Hypertension Brother    Heart disease Brother    Hypertension Brother     Social History   Socioeconomic History   Marital status: Widowed    Spouse name: Not on  file   Number of children: Not on file   Years of education: Not on file   Highest education level: Not on file  Occupational History   Not on file  Tobacco Use   Smoking status: Every Day    Packs/day: 1.00    Types: Cigarettes   Smokeless tobacco: Never  Substance and Sexual Activity   Alcohol use: Yes    Alcohol/week: 3.0 standard drinks    Types: 3 Cans of beer per week    Comment: 3-4 beers a couple times a week   Drug use: Not on file   Sexual activity: Not on file  Other Topics Concern   Not on file  Social History Narrative   Not on file   Social Determinants of Health   Financial Resource Strain: Not on file  Food Insecurity: Not on file  Transportation Needs: Not on file  Physical Activity: Not on file   Stress: Not on file  Social Connections: Not on file  Intimate Partner Violence: Not on file      Review of Systems  Constitutional:  Negative for chills, fatigue and unexpected weight change.  HENT:  Negative for congestion, postnasal drip, rhinorrhea, sneezing and sore throat.   Eyes:  Negative for redness.  Respiratory:  Negative for cough, chest tightness and shortness of breath.   Cardiovascular:  Negative for chest pain and palpitations.  Gastrointestinal:  Negative for abdominal pain, constipation, diarrhea, nausea and vomiting.  Genitourinary:  Negative for dysuria and frequency.  Musculoskeletal:  Negative for arthralgias, back pain, joint swelling and neck pain.  Skin:  Negative for rash.  Neurological: Negative.  Negative for tremors, numbness and headaches.  Hematological:  Negative for adenopathy. Does not bruise/bleed easily.  Psychiatric/Behavioral:  Negative for behavioral problems (Depression), sleep disturbance and suicidal ideas. The patient is not nervous/anxious.    Vital Signs: BP (!) 164/90   Pulse 82   Temp (!) 97.2 F (36.2 C)   Resp 16   Ht 5\' 5"  (1.651 m)   Wt 191 lb 6.4 oz (86.8 kg)   SpO2 98%   BMI 31.85 kg/m    Physical Exam Vitals and nursing note reviewed.  Constitutional:      General: She is not in acute distress.    Appearance: She is well-developed. She is obese. She is not diaphoretic.  HENT:     Head: Normocephalic and atraumatic.     Mouth/Throat:     Pharynx: No oropharyngeal exudate.  Eyes:     Pupils: Pupils are equal, round, and reactive to light.  Neck:     Thyroid: No thyromegaly.     Vascular: No JVD.     Trachea: No tracheal deviation.  Cardiovascular:     Rate and Rhythm: Normal rate and regular rhythm.     Heart sounds: Normal heart sounds. No murmur heard.   No friction rub. No gallop.  Pulmonary:     Effort: Pulmonary effort is normal. No respiratory distress.     Breath sounds: No wheezing or rales.  Chest:      Chest wall: No tenderness.  Abdominal:     General: Bowel sounds are normal.     Palpations: Abdomen is soft.  Musculoskeletal:        General: Normal range of motion.     Cervical back: Normal range of motion and neck supple.  Lymphadenopathy:     Cervical: No cervical adenopathy.  Skin:    General: Skin is warm and dry.  Neurological:     Mental Status: She is alert and oriented to person, place, and time.     Cranial Nerves: No cranial nerve deficit.  Psychiatric:        Behavior: Behavior normal.        Thought Content: Thought content normal.        Judgment: Judgment normal.       Assessment/Plan: 1. Essential hypertension Elevated in office and has been high at home as well. Will double up on losartan to 50mg  total. She will monitor closely at home. Will consider ordering PSG despite negative test in years past if not improving and if unable to lose wt.  2. Hypothyroidism, unspecified type Continue current dose  3. GAD (generalized anxiety disorder) Stable, continue prozac  4. Pure hypercholesterolemia Working on weight loss and changing diet. Will recheck labs and discuss statin again in future.   5. Dysuria - POCT Urinalysis Dipstick did show some blood in urine following recent UTI, will go ahead and send culture and recheck urine next visit   General Counseling: tokiko diefenderfer understanding of the findings of todays visit and agrees with plan of treatment. I have discussed any further diagnostic evaluation that may be needed or ordered today. We also reviewed her medications today. she has been encouraged to call the office with any questions or concerns that should arise related to todays visit.    Orders Placed This Encounter  Procedures   CULTURE, URINE COMPREHENSIVE   POCT Urinalysis Dipstick    No orders of the defined types were placed in this encounter.   This patient was seen by Drema Dallas, PA-C in collaboration with Dr. Clayborn Bigness  as a part of collaborative care agreement.   Total time spent:30 Minutes Time spent includes review of chart, medications, test results, and follow up plan with the patient.      Dr Lavera Guise Internal medicine

## 2020-10-22 LAB — CULTURE, URINE COMPREHENSIVE

## 2020-11-11 ENCOUNTER — Encounter: Payer: Self-pay | Admitting: Physician Assistant

## 2020-11-11 ENCOUNTER — Other Ambulatory Visit: Payer: Self-pay

## 2020-11-11 ENCOUNTER — Ambulatory Visit: Payer: 59 | Admitting: Physician Assistant

## 2020-11-11 ENCOUNTER — Telehealth: Payer: Self-pay

## 2020-11-11 VITALS — BP 146/86 | HR 82 | Temp 97.2°F | Resp 16 | Ht 65.0 in | Wt 192.6 lb

## 2020-11-11 DIAGNOSIS — R300 Dysuria: Secondary | ICD-10-CM | POA: Diagnosis not present

## 2020-11-11 DIAGNOSIS — I10 Essential (primary) hypertension: Secondary | ICD-10-CM | POA: Diagnosis not present

## 2020-11-11 DIAGNOSIS — E039 Hypothyroidism, unspecified: Secondary | ICD-10-CM | POA: Diagnosis not present

## 2020-11-11 DIAGNOSIS — N950 Postmenopausal bleeding: Secondary | ICD-10-CM

## 2020-11-11 DIAGNOSIS — F411 Generalized anxiety disorder: Secondary | ICD-10-CM

## 2020-11-11 MED ORDER — LOSARTAN POTASSIUM 50 MG PO TABS: 50.0000 mg | ORAL_TABLET | Freq: Every day | ORAL | 1 refills | Status: DC

## 2020-11-11 NOTE — Telephone Encounter (Signed)
I scheduled ultrasound appointment, then notified patient with appointment date & time-Toni

## 2020-11-11 NOTE — Progress Notes (Signed)
[facility] [place], [place] [address]  Internal MEDICINE  Office Visit Note  Patient Name: Deanna Howard  [id number]  [id number]  Date of Service: 11/13/2020  Chief Complaint  Patient presents with   Follow-up    Bleeding again and cramps, hasn't had period in over a year, last pap was 2 months ago, everything was normal, UTI 6 weeks ago   Depression   Hyperlipidemia    HPI Pt is here for routine follow up -Bp has been 130-140s/70-80s at home, but some lower readings in 120s/ 70s. States she feels much better. Recheck in office did not change much 148/80. Discussed adding additional therapy however patient would like to hold off since better at home and will bring log next visit. -Started having vaginal bleeding and cramping yesterday and into today. Had bled off and on back in 2021, but it has been years since last true cycle and never had abdominal cramping and it is unsuaully heavy now. Last month had a little bleeding too. LH and [employer] labs drawn several months ago indicated postmenopausal range. Discussed obtaining a pelvic US to evaluate and pt may need to follow up with her OBGYN. -Did have some blood in her urine last visit after having cleared a UTI and had plans to recheck urine today, but due to current vaginal bleeding test would be inaccurate. Discussed going to [facility] to give urine sample if bleeding stopped.  Current Medication: Outpatient Encounter Medications as of 11/11/2020  Medication Sig   ALPRAZolam (XANAX) 0.25 MG tablet Take 0.25 mg by mouth at bedtime as needed for anxiety. Takes as needed for anxiety   FLUoxetine (PROZAC) 10 MG tablet Take 10 mg by mouth daily.   losartan (COZAAR) 50 MG tablet Take 1 tablet (50 mg total) by mouth daily.   omeprazole (PRILOSEC) 20 MG capsule Take 20 mg by mouth daily.   sucralfate (CARAFATE) 1 g tablet Take 1 g by mouth 4 (four) times daily. Takes 4 times daily when needed   thyroid (ARMOUR) 15 MG tablet  Take 3 tablets (45 mg total) by mouth daily.   TURMERIC PO Take 1,000 mg by mouth. Takes two to three a day   [DISCONTINUED] losartan (COZAAR) 25 MG tablet Take 1 tablet (25 mg total) by mouth daily.   No facility-administered encounter medications on file as of 11/11/2020.    Surgical History: Past Surgical History:  Procedure Laterality Date   APPENDECTOMY     CHOLECYSTECTOMY     COLON SURGERY     removed tumor from colon   HIP SURGERY     cartiledge repair, shaving of the bone.   ROTATOR CUFF REPAIR     SPINAL FUSION      Medical History: Past Medical History:  Diagnosis Date   Depression    Femoroacetabular impingement of both hips    History of duodenal ulcer    Hyperlipidemia    Osteoarthritis    Thyroid disease     Family History: Family History  Problem Relation Age of Onset   Neuropathy Mother    Hypertension Mother    Heart disease Father    Kidney disease Father    Hypertension Father    Heart attack Father    COPD Father    Hypertension Brother    Heart disease Brother    Heart disease Brother    Hypertension Brother    Heart disease Brother    Hypertension Brother     Social History  Socioeconomic History   Marital status: Widowed    Spouse name: Not on file   Number of children: Not on file   Years of education: Not on file   Highest education level: Not on file  Occupational History   Not on file  Tobacco Use   Smoking status: Every Day    Packs/day: 1.00    Types: Cigarettes   Smokeless tobacco: Never  Substance and Sexual Activity   Alcohol use: Yes    Alcohol/week: 3.0 standard drinks    Types: 3 Cans of beer per week    Comment: 3-4 beers a couple times a week   Drug use: Never   Sexual activity: Not on file  Other Topics Concern   Not on file  Social History Narrative   Not on file   Social Determinants of Health   Financial Resource Strain: Not on file  Food Insecurity: Not on file  Transportation Needs: Not on file   Physical Activity: Not on file  Stress: Not on file  Social Connections: Not on file  Intimate Partner Violence: Not on file      Review of Systems  Constitutional:  Negative for chills, fatigue and unexpected weight change.  HENT:  Negative for congestion, postnasal drip, rhinorrhea, sneezing and sore throat.   Eyes:  Negative for redness.  Respiratory:  Negative for cough, chest tightness and shortness of breath.   Cardiovascular:  Negative for chest pain and palpitations.  Gastrointestinal:  Negative for abdominal pain, constipation, diarrhea, nausea and vomiting.  Genitourinary:  Positive for pelvic pain and vaginal bleeding. Negative for dysuria and frequency.  Musculoskeletal:  Negative for arthralgias, back pain, joint swelling and neck pain.  Skin:  Negative for rash.  Neurological: Negative.  Negative for tremors and numbness.  Hematological:  Negative for adenopathy. Does not bruise/bleed easily.  Psychiatric/Behavioral:  Negative for behavioral problems (Depression), sleep disturbance and suicidal ideas. The patient is not nervous/anxious.    Vital Signs: BP (!) 146/86   Pulse 82   Temp (!) 97.2 F (36.2 C)   Resp 16   Ht '5\' 5"'$  (1.651 m)   Wt 192 lb 9.6 oz (87.4 kg)   SpO2 99%   BMI 32.05 kg/m    Physical Exam Vitals and nursing note reviewed.  Constitutional:      General: She is not in acute distress.    Appearance: She is well-developed. She is obese. She is not diaphoretic.  HENT:     Head: Normocephalic and atraumatic.     Mouth/Throat:     Pharynx: No oropharyngeal exudate.  Eyes:     Pupils: Pupils are equal, round, and reactive to light.  Neck:     Thyroid: No thyromegaly.     Vascular: No JVD.     Trachea: No tracheal deviation.  Cardiovascular:     Rate and Rhythm: Normal rate and regular rhythm.     Heart sounds: Normal heart sounds. No murmur heard.   No friction rub. No gallop.  Pulmonary:     Effort: Pulmonary effort is normal. No  respiratory distress.     Breath sounds: No wheezing or rales.  Chest:     Chest wall: No tenderness.  Abdominal:     General: Bowel sounds are normal.     Palpations: Abdomen is soft.     Tenderness: There is no abdominal tenderness.  Musculoskeletal:        General: Normal range of motion.     Cervical back:  Normal range of motion and neck supple.  Lymphadenopathy:     Cervical: No cervical adenopathy.  Skin:    General: Skin is warm and dry.  Neurological:     Mental Status: She is alert and oriented to person, place, and time.     Cranial Nerves: No cranial nerve deficit.  Psychiatric:        Behavior: Behavior normal.        Thought Content: Thought content normal.        Judgment: Judgment normal.       Assessment/Plan: 1. Essential hypertension Improving but still elevated in office. Will continue '50mg'$  losartan and pt will bring log next visit. Consider adding another low dose med - losartan (COZAAR) 50 MG tablet; Take 1 tablet (50 mg total) by mouth daily.  Dispense: 90 tablet; Refill: 1  2. Postmenopausal bleeding Will obtain pelvic US due to abnormal vaginal bleeding. Will f/u with OBGYN as needed after results obtained - US Pelvic Complete With Transvaginal; Future  3. Hypothyroidism, unspecified type Stable, continue current dose  4. GAD (generalized anxiety disorder) Stable, continue prozac  5. Dysuria Will go to [facility] to give urine if vaginal bleeding stops. If blood remains in urine may need renal US - CULTURE, URINE COMPREHENSIVE   General Counseling: Gelila verbalizes understanding of the findings of todays visit and agrees with plan of treatment. I have discussed any further diagnostic evaluation that may be needed or ordered today. We also reviewed her medications today. she has been encouraged to call the office with any questions or concerns that should arise related to todays visit.    Orders Placed This Encounter  Procedures   CULTURE,  URINE COMPREHENSIVE   US Pelvic Complete With Transvaginal    Meds ordered this encounter  Medications   losartan (COZAAR) 50 MG tablet    Sig: Take 1 tablet (50 mg total) by mouth daily.    Dispense:  90 tablet    Refill:  1    This patient was seen by Drema Dallas, PA-C in collaboration with Dr. Clayborn Bigness as a part of collaborative care agreement.   Total time spent:35 Minutes Time spent includes review of chart, medications, test results, and follow up plan with the patient.      Dr Lavera Guise Internal medicine

## 2020-11-25 ENCOUNTER — Ambulatory Visit
Admission: RE | Admit: 2020-11-25 | Discharge: 2020-11-25 | Disposition: A | Discharge status: Home / Self Care | Payer: 59 | Source: Ambulatory Visit | Attending: Physician Assistant | Admitting: Physician Assistant

## 2020-11-25 ENCOUNTER — Other Ambulatory Visit: Payer: Self-pay

## 2020-11-25 DIAGNOSIS — N950 Postmenopausal bleeding: Secondary | ICD-10-CM | POA: Diagnosis not present

## 2020-11-26 ENCOUNTER — Encounter: Payer: Self-pay | Admitting: Internal Medicine

## 2020-11-26 NOTE — Progress Notes (Signed)
Needs to be seen for follow up to discuss pelvic u/s  Might beed CA 125 and then obgyn consult for further dx and rx

## 2020-11-28 ENCOUNTER — Encounter: Payer: Self-pay | Admitting: Internal Medicine

## 2020-11-28 ENCOUNTER — Ambulatory Visit (INDEPENDENT_AMBULATORY_CARE_PROVIDER_SITE_OTHER): Payer: 59 | Admitting: Internal Medicine

## 2020-11-28 ENCOUNTER — Other Ambulatory Visit: Payer: Self-pay

## 2020-11-28 VITALS — BP 140/80 | HR 91 | Temp 97.8°F | Resp 16 | Ht 65.0 in | Wt 197.0 lb

## 2020-11-28 DIAGNOSIS — N950 Postmenopausal bleeding: Secondary | ICD-10-CM | POA: Diagnosis not present

## 2020-11-28 DIAGNOSIS — N83201 Unspecified ovarian cyst, right side: Secondary | ICD-10-CM

## 2020-11-28 DIAGNOSIS — R5383 Other fatigue: Secondary | ICD-10-CM | POA: Diagnosis not present

## 2020-11-28 DIAGNOSIS — E039 Hypothyroidism, unspecified: Secondary | ICD-10-CM | POA: Diagnosis not present

## 2020-11-28 NOTE — Progress Notes (Signed)
[facility] [place], [place] [address]  Internal MEDICINE  Office Visit Note  Patient Name: Deanna Howard  [id number]  [id number]  Date of Service: 12/04/2020  Chief Complaint  Patient presents with   Follow-up    U/s   Depression   Hyperlipidemia    HPI Pt is here for routine follow up 1- Pelvic u/s is abnormal. 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 Simple cyst RIGHT ovary containing artifacts, cyst 2.6 cm greatest diameter; no follow-up imaging recommended as above. 6 mm thick endometrial complex, abnormal for a postmenopausal patient with bleeding; in the setting of post-menopausal bleeding, endometrial sampling is indicated to exclude carcinoma. If results are benign, sonohysterogram should be considered for focal lesion work-up. (Ref: Radiological Reasoning: Algorithmic Workup of Abnormal Vaginal Bleeding with Endovaginal Sonography and Sonohysterography. AJR 2008; [id number] 2- Pt has been on armour thyroid.however she is not sure why she was started on armour and not on synthroid 3- Other medical problem include depression with anxiety and HTN  Current Medication: Outpatient Encounter Medications as of 11/28/2020  Medication Sig   ALPRAZolam (XANAX) 0.25 MG tablet Take 0.25 mg by mouth at bedtime as needed for anxiety. Takes as needed for anxiety   FLUoxetine (PROZAC) 10 MG tablet Take 10 mg by mouth daily.   losartan (COZAAR) 50 MG tablet Take 1 tablet (50 mg total) by mouth daily.   TURMERIC PO Take 1,000 mg by mouth. Takes two to three a day   [DISCONTINUED] omeprazole (PRILOSEC) 20 MG capsule Take 20 mg by mouth daily.   [DISCONTINUED] sucralfate (CARAFATE) 1 g tablet Take 1 g by mouth 4 (four) times daily. Takes 4 times daily when needed   [DISCONTINUED] thyroid (ARMOUR) 15 MG tablet Take 3 tablets (45 mg total) by mouth daily.   No facility-administered encounter  medications on file as of 11/28/2020.    Surgical History: Past Surgical History:  Procedure Laterality Date   APPENDECTOMY     CHOLECYSTECTOMY     COLON SURGERY     removed tumor from colon   HIP SURGERY     cartiledge repair, shaving of the bone.   ROTATOR CUFF REPAIR     SPINAL FUSION      Medical History: Past Medical History:  Diagnosis Date   Depression    Femoroacetabular impingement of both hips    History of duodenal ulcer    Hyperlipidemia    Osteoarthritis    Thyroid disease     Family History: Family History  Problem Relation Age of Onset   Neuropathy Mother    Hypertension Mother    Heart disease Father    Kidney disease Father    Hypertension Father    Heart attack Father    COPD Father    Hypertension Brother    Heart disease Brother    Heart disease Brother    Hypertension Brother    Heart disease Brother    Hypertension Brother     Social History   Socioeconomic History   Marital status: Widowed    Spouse name: Not on file   Number of children: Not on file   Years of education: Not on file   Highest education level: Not on file  Occupational History   Not on file  Tobacco Use   Smoking status: Every Day    Packs/day: 1.00    Types: Cigarettes   Smokeless tobacco: Never  Substance and Sexual  Activity   Alcohol use: Yes    Alcohol/week: 3.0 standard drinks    Types: 3 Cans of beer per week    Comment: 3-4 beers a couple times a week   Drug use: Never   Sexual activity: Not on file  Other Topics Concern   Not on file  Social History Narrative   Not on file   Social Determinants of Health   Financial Resource Strain: Not on file  Food Insecurity: Not on file  Transportation Needs: Not on file  Physical Activity: Not on file  Stress: Not on file  Social Connections: Not on file  Intimate Partner Violence: Not on file      Review of Systems  Constitutional:  Negative for chills, fatigue and unexpected weight change.   HENT:  Negative for congestion, postnasal drip, rhinorrhea, sneezing and sore throat.   Eyes:  Negative for redness.  Respiratory:  Negative for cough, chest tightness and shortness of breath.   Cardiovascular:  Negative for chest pain and palpitations.  Gastrointestinal:  Negative for abdominal pain, constipation, diarrhea, nausea and vomiting.  Genitourinary:  Negative for dysuria and frequency.  Musculoskeletal:  Negative for arthralgias, back pain, joint swelling and neck pain.  Skin:  Negative for rash.  Neurological: Negative.  Negative for tremors and numbness.  Hematological:  Negative for adenopathy. Does not bruise/bleed easily.  Psychiatric/Behavioral:  Negative for behavioral problems (Depression), sleep disturbance and suicidal ideas. The patient is not nervous/anxious.    Vital Signs: BP 140/80   Pulse 91   Temp 97.8 F (36.6 C)   Resp 16   Ht '5\' 5"'$  (1.651 m)   Wt 197 lb (89.4 kg)   SpO2 96%   BMI 32.78 kg/m    Physical Exam Constitutional:      Appearance: Normal appearance.  HENT:     Head: Normocephalic and atraumatic.     Nose: Nose normal.     Mouth/Throat:     Mouth: Mucous membranes are moist.     Pharynx: No posterior oropharyngeal erythema.  Eyes:     Extraocular Movements: Extraocular movements intact.     Pupils: Pupils are equal, round, and reactive to light.  Cardiovascular:     Pulses: Normal pulses.     Heart sounds: Normal heart sounds.  Pulmonary:     Effort: Pulmonary effort is normal.     Breath sounds: Normal breath sounds.  Neurological:     General: No focal deficit present.     Mental Status: She is alert.  Psychiatric:        Mood and Affect: Mood normal.        Behavior: Behavior normal.       Assessment/Plan: 1. Cyst of right ovary Will add additional testing. Will need to see GYN - Estradiol - 17-Hydroxyprogesterone - CA 125 - B12 and Folate Panel - Iron, TIBC and Ferritin Panel - Ambulatory referral to  Obstetrics / Gynecology  2. Hypothyroidism, unspecified type Stop armour thyroid for now, repeat TSH and free T4, treat as indicated  - B12 and Folate Panel - Iron, TIBC and Ferritin Panel  3. Post-menopausal bleeding Abnormal U/S  - Ambulatory referral to Obstetrics / Gynecology  4. Other fatigue Update iron studies  - B12 and Folate Panel - Iron, TIBC and Ferritin Panel   General Counseling: Tijah verbalizes understanding of the findings of todays visit and agrees with plan of treatment. I have discussed any further diagnostic evaluation that may be needed or ordered today.  We also reviewed her medications today. she has been encouraged to call the office with any questions or concerns that should arise related to todays visit.    Orders Placed This Encounter  Procedures   Estradiol   17-Hydroxyprogesterone   CA 125   B12 and Folate Panel   Iron, TIBC and Ferritin Panel   Ambulatory referral to Obstetrics / Gynecology    No orders of the defined types were placed in this encounter.   Total time spent:30 Minutes Time spent includes review of chart, medications, test results, and follow up plan with the patient.   [place] Controlled Substance Database was reviewed by me.   Dr Lavera Guise Internal medicine

## 2020-11-29 LAB — B12 AND FOLATE PANEL
Folate: 10.3 ng/mL (ref >3.0)
Vitamin B-12: 470 pg/mL (ref 232–1245)

## 2020-11-29 LAB — IRON,TIBC AND FERRITIN PANEL
Ferritin: 66 ng/mL (ref 15–150)
Iron Saturation: 12 % — ABNORMAL LOW (ref 15–55)
Iron: 41 ug/dL (ref 27–159)
Total Iron Binding Capacity: 337 ug/dL (ref 250–450)
UIBC: 296 ug/dL (ref 131–425)

## 2020-11-30 LAB — ESTRADIOL: Estradiol: 76.8 pg/mL

## 2020-11-30 LAB — 17-HYDROXYPROGESTERONE: 17-Hydroxyprogesterone: 23 ng/dL

## 2020-11-30 LAB — CA 125: Cancer Antigen (CA) 125: 9.4 U/mL (ref 0.0–38.1)

## 2020-12-30 ENCOUNTER — Other Ambulatory Visit: Payer: Self-pay

## 2020-12-30 ENCOUNTER — Ambulatory Visit (INDEPENDENT_AMBULATORY_CARE_PROVIDER_SITE_OTHER): Payer: 59 | Admitting: Certified Nurse Midwife

## 2020-12-30 ENCOUNTER — Other Ambulatory Visit (HOSPITAL_COMMUNITY)
Admission: RE | Admit: 2020-12-30 | Discharge: 2020-12-30 | Disposition: A | Discharge status: Home / Self Care | Payer: 59 | Source: Ambulatory Visit | Attending: Certified Nurse Midwife | Admitting: Certified Nurse Midwife

## 2020-12-30 ENCOUNTER — Encounter: Payer: Self-pay | Admitting: Certified Nurse Midwife

## 2020-12-30 VITALS — BP 172/93 | HR 82 | Ht 65.0 in | Wt 196.2 lb

## 2020-12-30 DIAGNOSIS — N950 Postmenopausal bleeding: Secondary | ICD-10-CM | POA: Insufficient documentation

## 2020-12-30 DIAGNOSIS — D250 Submucous leiomyoma of uterus: Secondary | ICD-10-CM

## 2020-12-30 NOTE — Patient Instructions (Signed)

## 2020-12-30 NOTE — Progress Notes (Signed)
Endometrial Biopsy Procedure Note  Pre-operative Diagnosis: postmenopausal bleeding  Post-operative Diagnosis: same  Indications: abnormal uterine bleeding  Procedure Details   Urine pregnancy test was not done.  The risks (including infection, bleeding, pain, and uterine perforation) and benefits of the procedure were explained to the patient and Verbal informed consent was obtained.  Antibiotic prophylaxis against endocarditis was not indicated.   The patient was placed in the dorsal lithotomy position.  Bimanual exam showed the uterus to be in the anteroflexed position.  A  speculum inserted in the vagina, and the cervix prepped with povidone iodine.  Endocervical curettage with a Kevorkian curette was not performed.    A sharp tenaculum was applied to the anterior then posterior lip of the cervix for stabilization.  A sterile uterine sound was used to sound the uterus to a depth of 6cm after use of dilator.  A Pipelle endometrial aspirator was used to sample the endometrium.  Sample was sent for pathologic examination.  Condition: Stable  Complications: None  Plan:  The patient was advised to call for any fever or for prolonged or severe pain or bleeding. She was advised to use NSAID and OTC acetaminophen as needed for mild to moderate pain. She was advised to avoid vaginal intercourse for 48 hours or until the bleeding has completely stopped.  Philip Aspen, CNM

## 2020-12-31 LAB — SURGICAL PATHOLOGY

## 2021-01-06 ENCOUNTER — Ambulatory Visit: Payer: 59 | Admitting: Physician Assistant

## 2021-01-22 ENCOUNTER — Encounter: Payer: Self-pay | Admitting: Gastroenterology

## 2021-01-22 ENCOUNTER — Ambulatory Visit (INDEPENDENT_AMBULATORY_CARE_PROVIDER_SITE_OTHER): Payer: 59 | Admitting: Gastroenterology

## 2021-01-22 ENCOUNTER — Other Ambulatory Visit: Payer: Self-pay

## 2021-01-22 VITALS — BP 138/79 | HR 85 | Temp 98.9°F | Ht 60.0 in | Wt 196.0 lb

## 2021-01-22 DIAGNOSIS — R140 Abdominal distension (gaseous): Secondary | ICD-10-CM | POA: Diagnosis not present

## 2021-01-22 NOTE — Progress Notes (Signed)
Deanna Howard [address]  [place]  [place], [place] [address]  Main: [phone]  Fax: [phone]   Gastroenterology Consultation  Referring Provider:     Philip Aspen, CNM Primary Care Physician:  Mylinda Latina, PA-C Reason for Consultation:     Abdominal Bloating        HPI:    Chief Complaint  Patient presents with   New Patient (Initial Visit)    Abdominal bloating x 9 months    Deanna Howard is a 55 y.o. y/o female referred for consultation & management  by Dr. Chauncey Cruel, Si Gaul, PA-C.  Patient reports diffuse abdominal bloating symptoms for the last 9 months.  Occur with or without meals.  Is more severe with heavy meals.  No dysphagia, no nausea or vomiting.  No weight loss.  Denies any constipation.  Reports 1 soft bowel movement every morning.  No blood in stool.  Describes having an EGD in 2016 and was told that she had gastric ulcers.  Describes having a colonoscopy in 2011 and 2016.  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  And was recommended to get a colonoscopy.  Colonoscopy was done in 2011.  States no polyps were found in 2011 and 2016 colonoscopies.  Procedure reports not available and [place].  She states EGD in 2016 were done due to symptoms of nausea and heartburn but symptoms of bloating and now are different and that they are more severe and she does not have any heartburn at this time.  Is not taking any PPIs or antacids  Most recent labs show normal ferritin at 66, normal iron 41, with low iron saturation of 12%.  CMP shows normal liver enzymes.  CBC shows normal hemoglobin  Past Medical History:  Diagnosis Date   Depression    Femoroacetabular impingement of both hips    History of duodenal ulcer    Hyperlipidemia    Osteoarthritis    Thyroid disease     Past Surgical History:  Procedure Laterality Date   APPENDECTOMY     CHOLECYSTECTOMY     COLON  SURGERY     removed tumor from colon   COLONOSCOPY     HIP SURGERY     cartiledge repair, shaving of the bone.   ROTATOR CUFF REPAIR     SPINAL FUSION     UPPER GASTROINTESTINAL ENDOSCOPY      Prior to Admission medications   Medication Sig Start Date End Date Taking? Authorizing Provider  ALPRAZolam Duanne Moron) 0.25 MG tablet Take 0.25 mg by mouth at bedtime as needed for anxiety. Takes as needed for anxiety   Yes [provider]  FLUoxetine (PROZAC) 10 MG tablet Take 10 mg by mouth daily.   Yes [provider]  losartan (COZAAR) 50 MG tablet Take 1 tablet (50 mg total) by mouth daily. 11/11/20  Yes McDonough, Lauren K, PA-C  TURMERIC PO Take 1,000 mg by mouth. Takes two to three a day   Yes [provider]    Family History  Problem Relation Age of Onset   Neuropathy Mother    Hypertension Mother    Heart disease Father    Kidney disease Father    Hypertension Father    Heart attack Father    COPD Father    Hypertension Brother    Heart disease Brother    Heart disease Brother    Hypertension  Brother    Heart disease Brother    Hypertension Brother      Social History   Tobacco Use   Smoking status: Every Day    Packs/day: 1.00    Types: Cigarettes   Smokeless tobacco: Never  Substance Use Topics   Alcohol use: Yes    Alcohol/week: 3.0 standard drinks    Types: 3 Cans of beer per week    Comment: 3-4 beers a couple times a week   Drug use: Never    Allergies as of 01/22/2021   (No Known Allergies)    Review of Systems:    All systems reviewed and negative except where noted in HPI.   Physical Exam:  Constitutional: General:   Alert,  Well-developed, well-nourished, pleasant and cooperative in NAD BP 138/79 (BP Location: Right Arm, Patient Position: Sitting, Cuff Size: Large)   Pulse 85   Temp 98.9 F (37.2 C) (Oral)   Ht 5' (1.524 m)   Wt 196 lb (88.9 kg)   BMI 38.28 kg/m   Eyes:  Sclera clear, no icterus.   Conjunctiva  pink. PERRLA  Ears:  No scars, lesions or masses, Normal auditory acuity. Nose:  No deformity, discharge, or lesions. Mouth:  No deformity or lesions, oropharynx pink & moist.  Neck:  Supple; no masses or thyromegaly.  Respiratory: Normal respiratory effort, Normal percussion  Gastrointestinal: Soft, non-tender and non-distended without masses, hepatosplenomegaly or hernias noted.  No guarding or rebound tenderness.     Cardiac: No clubbing or edema.  No cyanosis. Normal posterior tibial pedal pulses noted.  Lymphatic:  No significant cervical or axillary adenopathy.  Psych:  Alert and cooperative. Normal mood and affect.  Musculoskeletal:  Normal gait. Head normocephalic, atraumatic. Symmetrical without gross deformities. 5/5 Upper and Lower extremity strength bilaterally.  Skin: Warm. Intact without significant lesions or rashes. No jaundice.  Neurologic:  Face symmetrical, tongue midline, Normal sensation to touch;  grossly normal neurologically.  Psych:  Alert and oriented x3, Alert and cooperative. Normal mood and affect.   Labs: CBC    Component Value Date/Time   WBC 7.8 07/16/2020 0716   RBC 4.75 07/16/2020 0716   HGB 14.5 07/16/2020 0716   HCT 43.0 07/16/2020 0716   PLT 238 07/16/2020 0716   MCV 91 07/16/2020 0716   MCH 30.5 07/16/2020 0716   MCHC 33.7 07/16/2020 0716   RDW 12.7 07/16/2020 0716   LYMPHSABS 1.9 07/16/2020 0716   EOSABS 0.2 07/16/2020 0716   BASOSABS 0.1 07/16/2020 0716   CMP     Component Value Date/Time   NA 138 08/02/2020 0705   K 3.7 08/02/2020 0705   CL 100 08/02/2020 0705   CO2 23 08/02/2020 0705   GLUCOSE 96 08/02/2020 0705   BUN 14 08/02/2020 0705   CREATININE 0.77 08/02/2020 0705   CALCIUM 9.4 08/02/2020 0705   PROT 6.7 08/02/2020 0705   ALBUMIN 4.4 08/02/2020 0705   AST 17 08/02/2020 0705   ALT 25 08/02/2020 0705   ALKPHOS 83 08/02/2020 0705   BILITOT 0.3 08/02/2020 0705    Imaging Studies: No results  found.  Assessment and Plan:   Deanna Howard is a 55 y.o. y/o female has been referred for abdominal bloating  Labs are otherwise reassuring Patient advised on low FODMAP diet.  High FODMAP diet handout also given and patient advised to identify 3-4 foods that she may be consuming frequently and try to avoid those to see if it helps with her symptoms  Check H.  pylori breath test  We will try to obtain her previous records in regard to her EGD and colonoscopy to evaluate if she is due for another colonoscopy depending on what kind of "tumor" was found previously.  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 pylori breath test is completed   Dr Deanna Howard  Speech recognition software was used to dictate the above note.

## 2021-01-24 LAB — H. PYLORI BREATH TEST: H pylori Breath Test: NEGATIVE

## 2021-01-28 MED ORDER — OMEPRAZOLE 20 MG PO CPDR: 20.0000 mg | DELAYED_RELEASE_CAPSULE | Freq: Every day | ORAL | 0 refills | Status: DC

## 2021-01-28 NOTE — Addendum Note (Signed)
Addended by: Vonda Antigua on: 01/28/2021 01:33 PM   Modules accepted: Orders

## 2021-01-29 ENCOUNTER — Encounter: Payer: Self-pay | Admitting: Gastroenterology

## 2021-01-29 DIAGNOSIS — R109 Unspecified abdominal pain: Secondary | ICD-10-CM

## 2021-01-29 DIAGNOSIS — R112 Nausea with vomiting, unspecified: Secondary | ICD-10-CM

## 2021-01-31 ENCOUNTER — Encounter: Payer: Self-pay | Admitting: Physician Assistant

## 2021-01-31 ENCOUNTER — Ambulatory Visit: Payer: 59 | Admitting: Physician Assistant

## 2021-01-31 ENCOUNTER — Other Ambulatory Visit: Payer: Self-pay

## 2021-01-31 DIAGNOSIS — E039 Hypothyroidism, unspecified: Secondary | ICD-10-CM | POA: Diagnosis not present

## 2021-01-31 DIAGNOSIS — I10 Essential (primary) hypertension: Secondary | ICD-10-CM

## 2021-01-31 DIAGNOSIS — H6691 Otitis media, unspecified, right ear: Secondary | ICD-10-CM | POA: Diagnosis not present

## 2021-01-31 DIAGNOSIS — J0100 Acute maxillary sinusitis, unspecified: Secondary | ICD-10-CM

## 2021-01-31 DIAGNOSIS — F411 Generalized anxiety disorder: Secondary | ICD-10-CM

## 2021-01-31 MED ORDER — FLUCONAZOLE 150 MG PO TABS: 150.0000 mg | ORAL_TABLET | Freq: Once | ORAL | 0 refills | Status: AC

## 2021-01-31 MED ORDER — AMOXICILLIN-POT CLAVULANATE 875-125 MG PO TABS
1.0000 | ORAL_TABLET | Freq: Two times a day (BID) | ORAL | 0 refills | Status: DC
Start: 2021-01-31 — End: 2021-06-19

## 2021-01-31 MED ORDER — LOSARTAN POTASSIUM 50 MG PO TABS: 50.0000 mg | ORAL_TABLET | Freq: Every day | ORAL | 1 refills | Status: DC

## 2021-01-31 NOTE — Progress Notes (Signed)
[facility] [place],  [address]  Internal MEDICINE  Office Visit Note  Patient Name: Deanna Howard  [id number]  [id number]  Date of Service: 02/02/2021  Chief Complaint  Patient presents with   Follow-up   Ear Pain    Right    Hyperlipidemia   Depression    HPI Pt is here for routine follow up -No more menopausal bleeding. She was referred to [facility] after an abnormal pelvic US and had a biopsy which she states was negative. She is only supposed tot follow up with them as needed at this point. -Right ear has been hurting for 3 weeks. She is also having some sinus congestion -stopped armour thyroid about 2 months ago because unsure if she really needs this orr why she was put on this instead of synthroid. Now that she has stopped for 2 months will recheck labs  -Did see GI due to chronic abdominal bloating and is now waiting to hear back from GI regarding whether a colonoscopy will be done--working to obtain prior records first. May also consider upper endoscopy as well  Current Medication: Outpatient Encounter Medications as of 01/31/2021  Medication Sig   ALPRAZolam (XANAX) 0.25 MG tablet Take 0.25 mg by mouth at bedtime as needed for anxiety. Takes as needed for anxiety   amoxicillin-clavulanate (AUGMENTIN) 875-125 MG tablet Take 1 tablet by mouth 2 (two) times daily. Take with food.   [EXPIRED] fluconazole (DIFLUCAN) 150 MG tablet Take 1 tablet (150 mg total) by mouth once for 1 dose.   FLUoxetine (PROZAC) 10 MG tablet Take 10 mg by mouth daily.   omeprazole (PRILOSEC) 20 MG capsule Take 1 capsule (20 mg total) by mouth daily for 28 days.   TURMERIC PO Take 1,000 mg by mouth. Takes two to three a day   [DISCONTINUED] losartan (COZAAR) 50 MG tablet Take 1 tablet (50 mg total) by mouth daily.   losartan (COZAAR) 50 MG tablet Take 1 tablet (50 mg total) by mouth daily.   No facility-administered encounter medications on file as of 01/31/2021.     Surgical History: Past Surgical History:  Procedure Laterality Date   APPENDECTOMY     CHOLECYSTECTOMY     COLON SURGERY     removed tumor from colon   COLONOSCOPY     HIP SURGERY     cartiledge repair, shaving of the bone.   ROTATOR CUFF REPAIR     SPINAL FUSION     UPPER GASTROINTESTINAL ENDOSCOPY      Medical History: Past Medical History:  Diagnosis Date   Depression    Femoroacetabular impingement of both hips    History of duodenal ulcer    Hyperlipidemia    Osteoarthritis    Thyroid disease     Family History: Family History  Problem Relation Age of Onset   Neuropathy Mother    Hypertension Mother    Heart disease Father    Kidney disease Father    Hypertension Father    Heart attack Father    COPD Father    Hypertension Brother    Heart disease Brother    Heart disease Brother    Hypertension Brother    Heart disease Brother    Hypertension Brother     Social History   Socioeconomic History   Marital status: Widowed    Spouse name: Not on file   Number of children: Not on file   Years of education: Not on file   Highest education level:  Not on file  Occupational History   Not on file  Tobacco Use   Smoking status: Every Day    Packs/day: 1.00    Types: Cigarettes   Smokeless tobacco: Never  Substance and Sexual Activity   Alcohol use: Yes    Alcohol/week: 3.0 standard drinks    Types: 3 Cans of beer per week    Comment: 3-4 beers a couple times a week   Drug use: Never   Sexual activity: Not on file  Other Topics Concern   Not on file  Social History Narrative   Not on file   Social Determinants of Health   Financial Resource Strain: Not on file  Food Insecurity: Not on file  Transportation Needs: Not on file  Physical Activity: Not on file  Stress: Not on file  Social Connections: Not on file  Intimate Partner Violence: Not on file      Review of Systems  Constitutional:  Negative for chills, fatigue and unexpected  weight change.  HENT:  Positive for congestion, ear discharge and rhinorrhea. Negative for postnasal drip, sneezing and sore throat.   Eyes:  Negative for redness.  Respiratory:  Negative for cough, chest tightness and shortness of breath.   Cardiovascular:  Negative for chest pain and palpitations.  Gastrointestinal:  Negative for abdominal pain, constipation, diarrhea, nausea and vomiting.  Genitourinary:  Negative for dysuria and frequency.  Musculoskeletal:  Negative for arthralgias, back pain, joint swelling and neck pain.  Skin:  Negative for rash.  Neurological: Negative.  Negative for tremors and numbness.  Hematological:  Negative for adenopathy. Does not bruise/bleed easily.  Psychiatric/Behavioral:  Negative for behavioral problems (Depression), sleep disturbance and suicidal ideas. The patient is not nervous/anxious.    Vital Signs: BP 140/84   Pulse 88   Temp 98 F (36.7 C)   Resp 16   Ht 5\' 5"  (1.651 m)   Wt 194 lb (88 kg)   SpO2 98%   BMI 32.28 kg/m    Physical Exam Constitutional:      General: She is not in acute distress.    Appearance: Normal appearance. She is well-developed. She is not diaphoretic.  HENT:     Head: Normocephalic and atraumatic.     Left Ear: Tympanic membrane normal.     Ears:     Comments: Right canal with mild erythema and possible fluid behind TM    Nose: Nose normal.     Mouth/Throat:     Mouth: Mucous membranes are moist.     Pharynx: No oropharyngeal exudate or posterior oropharyngeal erythema.  Eyes:     Extraocular Movements: Extraocular movements intact.     Pupils: Pupils are equal, round, and reactive to light.  Neck:     Thyroid: No thyromegaly.     Vascular: No JVD.     Trachea: No tracheal deviation.  Cardiovascular:     Rate and Rhythm: Normal rate and regular rhythm.     Pulses: Normal pulses.     Heart sounds: Normal heart sounds. No murmur heard.   No friction rub. No gallop.  Pulmonary:     Effort: Pulmonary  effort is normal. No respiratory distress.     Breath sounds: Normal breath sounds. No wheezing or rales.  Chest:     Chest wall: No tenderness.  Abdominal:     General: Bowel sounds are normal.     Palpations: Abdomen is soft.  Musculoskeletal:        General: Normal range  of motion.     Cervical back: Normal range of motion and neck supple.  Lymphadenopathy:     Cervical: No cervical adenopathy.  Skin:    General: Skin is warm and dry.  Neurological:     General: No focal deficit present.     Mental Status: She is alert and oriented to person, place, and time.     Cranial Nerves: No cranial nerve deficit.  Psychiatric:        Mood and Affect: Mood normal.        Behavior: Behavior normal.        Thought Content: Thought content normal.        Judgment: Judgment normal.       Assessment/Plan: 1. Essential hypertension Stable, continue current medications - losartan (COZAAR) 50 MG tablet; Take 1 tablet (50 mg total) by mouth daily.  Dispense: 90 tablet; Refill: 1  2. Hypothyroidism, unspecified type Has held Armour Thyroid for the past 2 months therefore we will recheck labs consider reinitiation medication as needed - TSH+T4F+T3Free  3. Acute non-recurrent maxillary sinusitis Started on Augmentin twice daily with food for 10 days.  Patient should also take Mucinex and utilize nasal spray - amoxicillin-clavulanate (AUGMENTIN) 875-125 MG tablet; Take 1 tablet by mouth 2 (two) times daily. Take with food.  Dispense: 20 tablet; Refill: 0  4. Right otitis media, unspecified otitis media type Will start on Augmentin to be taken with food - amoxicillin-clavulanate (AUGMENTIN) 875-125 MG tablet; Take 1 tablet by mouth 2 (two) times daily. Take with food.  Dispense: 20 tablet; Refill: 0  5. GAD (generalized anxiety disorder) Stable, continue current medications   General Counseling: teneil shiller understanding of the findings of todays visit and agrees with plan of  treatment. I have discussed any further diagnostic evaluation that may be needed or ordered today. We also reviewed her medications today. she has been encouraged to call the office with any questions or concerns that should arise related to todays visit.    Orders Placed This Encounter  Procedures   TSH+T4F+T3Free    Meds ordered this encounter  Medications   amoxicillin-clavulanate (AUGMENTIN) 875-125 MG tablet    Sig: Take 1 tablet by mouth 2 (two) times daily. Take with food.    Dispense:  20 tablet    Refill:  0   fluconazole (DIFLUCAN) 150 MG tablet    Sig: Take 1 tablet (150 mg total) by mouth once for 1 dose.    Dispense:  1 tablet    Refill:  0   losartan (COZAAR) 50 MG tablet    Sig: Take 1 tablet (50 mg total) by mouth daily.    Dispense:  90 tablet    Refill:  1    This patient was seen by Drema Dallas, PA-C in collaboration with Dr. Clayborn Bigness as a part of collaborative care agreement.   Total time spent:30 Minutes Time spent includes review of chart, medications, test results, and follow up plan with the patient.      Dr Lavera Guise Internal medicine

## 2021-02-12 ENCOUNTER — Other Ambulatory Visit: Payer: Self-pay | Admitting: Internal Medicine

## 2021-02-12 DIAGNOSIS — E039 Hypothyroidism, unspecified: Secondary | ICD-10-CM

## 2021-02-12 DIAGNOSIS — M159 Polyosteoarthritis, unspecified: Secondary | ICD-10-CM

## 2021-02-12 DIAGNOSIS — Z7689 Persons encountering health services in other specified circumstances: Secondary | ICD-10-CM

## 2021-02-12 DIAGNOSIS — R030 Elevated blood-pressure reading, without diagnosis of hypertension: Secondary | ICD-10-CM

## 2021-02-12 DIAGNOSIS — R5383 Other fatigue: Secondary | ICD-10-CM

## 2021-02-12 DIAGNOSIS — K257 Chronic gastric ulcer without hemorrhage or perforation: Secondary | ICD-10-CM

## 2021-02-12 DIAGNOSIS — F411 Generalized anxiety disorder: Secondary | ICD-10-CM

## 2021-02-12 NOTE — Telephone Encounter (Signed)
Can you please review 

## 2021-03-12 ENCOUNTER — Encounter: Payer: Self-pay | Admitting: Gastroenterology

## 2021-03-12 NOTE — Progress Notes (Signed)
Previous records reviewed and shows that patient had an EGD and colonoscopy in March 2015 in [place] in [place].  This has been sent for scanning (see scanned report).  It was done for epigastric abdominal pain, bloating, change in bowel function.  Postoperative diagnosis was GERD, distal gastritis, gastric ulcer in the gastric antrum near the pylorus, duodenitis, normal-appearing colonic mucosa.  Pathology showed no significant pathology in the small bowel biopsies.  Antral biopsies were normal as were likely with no H. pylori.  Distal esophageal biopsies showed reactive changes.  There were no colon biopsies done.  This colonoscopy report does not mention any abnormalities in the colon or at the appendiceal orifice.  The appendiceal abnormalities that patient mentioned during her last clinic visit may have been on her 2011 colonoscopy instead  Patient should follow-up in clinic to discuss any ongoing symptoms and further work-up as necessary.  Based on the above procedure reports and discussion during recent clinic appointment, no indication for urgent endoscopy at this time  There is an appointment recall in her chart for an appointment to be scheduled in January 2023.  Patient was prescribed PPI short-term after her last appointment as well

## 2021-03-28 ENCOUNTER — Other Ambulatory Visit: Payer: Self-pay

## 2021-03-28 ENCOUNTER — Ambulatory Visit
Admission: RE | Admit: 2021-03-28 | Discharge: 2021-03-28 | Disposition: A | Discharge status: Home / Self Care | Payer: 59 | Source: Ambulatory Visit | Attending: Gastroenterology | Admitting: Gastroenterology

## 2021-03-28 DIAGNOSIS — R109 Unspecified abdominal pain: Secondary | ICD-10-CM

## 2021-03-28 DIAGNOSIS — R112 Nausea with vomiting, unspecified: Secondary | ICD-10-CM

## 2021-03-28 LAB — POCT I-STAT CREATININE: Creatinine, Ser: 0.7 mg/dL (ref 0.44–1.00)

## 2021-03-28 MED ORDER — IOHEXOL 300 MG/ML  SOLN
100.0000 mL | Freq: Once | INTRAMUSCULAR | Status: AC | PRN
  Administered 2021-03-28: 11:00:00 100 mL via INTRAVENOUS

## 2021-04-09 ENCOUNTER — Encounter: Payer: Self-pay | Admitting: Gastroenterology

## 2021-04-11 ENCOUNTER — Ambulatory Visit (INDEPENDENT_AMBULATORY_CARE_PROVIDER_SITE_OTHER): Payer: 59 | Admitting: Physician Assistant

## 2021-04-11 ENCOUNTER — Other Ambulatory Visit: Payer: Self-pay

## 2021-04-11 ENCOUNTER — Encounter: Payer: Self-pay | Admitting: Physician Assistant

## 2021-04-11 DIAGNOSIS — R5383 Other fatigue: Secondary | ICD-10-CM

## 2021-04-11 DIAGNOSIS — E039 Hypothyroidism, unspecified: Secondary | ICD-10-CM | POA: Diagnosis not present

## 2021-04-11 DIAGNOSIS — F411 Generalized anxiety disorder: Secondary | ICD-10-CM

## 2021-04-11 DIAGNOSIS — T84498A Other mechanical complication of other internal orthopedic devices, implants and grafts, initial encounter: Secondary | ICD-10-CM | POA: Diagnosis not present

## 2021-04-11 DIAGNOSIS — I10 Essential (primary) hypertension: Secondary | ICD-10-CM | POA: Diagnosis not present

## 2021-04-11 DIAGNOSIS — E559 Vitamin D deficiency, unspecified: Secondary | ICD-10-CM

## 2021-04-11 DIAGNOSIS — E7800 Pure hypercholesterolemia, unspecified: Secondary | ICD-10-CM

## 2021-04-11 MED ORDER — LOSARTAN POTASSIUM 50 MG PO TABS: 50.0000 mg | ORAL_TABLET | Freq: Every day | ORAL | 1 refills | Status: DC

## 2021-04-11 MED ORDER — FLUOXETINE HCL 20 MG PO TABS: ORAL_TABLET | ORAL | 1 refills | Status: DC

## 2021-04-11 NOTE — Progress Notes (Signed)
[facility] [place], [place] [address]  Internal MEDICINE  Office Visit Note  Patient Name: Deanna Howard  [id number]  [id number]  Date of Service: 04/11/2021  Chief Complaint  Patient presents with   Follow-up   Depression   Hyperlipidemia   Arthritis    HPI Pt is here for follow up to review findings on recent CT done by GI -While undergoing CT of abdomen and pelvis via GI physician, it was discovered that some of the hardware along her spine has fractured -Her original spinal surgery was in 2012. She states her previous physician retired, but was out of state anyway.  CT abd/pelvis 03/28/21: Changes of instrumented PLIF L4-5 and L5-S1. Fracture of left L4 and bilateral S1 pedicle screws. There is graft in the interspaces with mild subsidence, no gas, suggesting interbody fusion."  -Does state that there were a few changes seen on xray a few years ago as well, but thought it was ok at that time.  -Discussed referring to neurosurgery locally to evaluate hardware further -Patient does have chronic pain, but denies any acute worsening or recent changes -Otherwise things are going well -Has not taken BP meds yet today -CPE next visit, will order routine fasting labs  Current Medication: Outpatient Encounter Medications as of 04/11/2021  Medication Sig   ALPRAZolam (XANAX) 0.25 MG tablet Take 0.25 mg by mouth at bedtime as needed for anxiety. Takes as needed for anxiety   amoxicillin-clavulanate (AUGMENTIN) 875-125 MG tablet Take 1 tablet by mouth 2 (two) times daily. Take with food.   FLUoxetine (PROZAC) 20 MG tablet Take 1/2 tablet by mouth daily.   TURMERIC PO Take 1,000 mg by mouth. Takes two to three a day   [DISCONTINUED] FLUoxetine (PROZAC) 10 MG tablet Take 10 mg by mouth daily.   [DISCONTINUED] losartan (COZAAR) 50 MG tablet Take 1 tablet (50 mg total) by mouth daily.   losartan (COZAAR) 50 MG tablet Take 1 tablet (50 mg total) by mouth daily.    omeprazole (PRILOSEC) 20 MG capsule Take 1 capsule (20 mg total) by mouth daily for 28 days.   No facility-administered encounter medications on file as of 04/11/2021.    Surgical History: Past Surgical History:  Procedure Laterality Date   APPENDECTOMY     CHOLECYSTECTOMY     COLON SURGERY     removed tumor from colon   COLONOSCOPY     HIP SURGERY     cartiledge repair, shaving of the bone.   ROTATOR CUFF REPAIR     SPINAL FUSION     UPPER GASTROINTESTINAL ENDOSCOPY      Medical History: Past Medical History:  Diagnosis Date   Depression    Femoroacetabular impingement of both hips    History of duodenal ulcer    Hyperlipidemia    Osteoarthritis    Thyroid disease     Family History: Family History  Problem Relation Age of Onset   Neuropathy Mother    Hypertension Mother    Heart disease Father    Kidney disease Father    Hypertension Father    Heart attack Father    COPD Father    Hypertension Brother    Heart disease Brother    Heart disease Brother    Hypertension Brother    Heart disease Brother    Hypertension Brother     Social History   Socioeconomic History   Marital status: Widowed    Spouse name: Not on file   Number of  children: Not on file   Years of education: Not on file   Highest education level: Not on file  Occupational History   Not on file  Tobacco Use   Smoking status: Every Day    Packs/day: 1.00    Types: Cigarettes   Smokeless tobacco: Never  Substance and Sexual Activity   Alcohol use: Yes    Alcohol/week: 3.0 standard drinks    Types: 3 Cans of beer per week    Comment: 3-4 beers a couple times a week   Drug use: Never   Sexual activity: Not on file  Other Topics Concern   Not on file  Social History Narrative   Not on file   Social Determinants of Health   Financial Resource Strain: Not on file  Food Insecurity: Not on file  Transportation Needs: Not on file  Physical Activity: Not on file  Stress: Not on  file  Social Connections: Not on file  Intimate Partner Violence: Not on file      Review of Systems  Constitutional:  Negative for chills, fatigue and unexpected weight change.  HENT:  Negative for congestion, postnasal drip, rhinorrhea, sneezing and sore throat.   Eyes:  Negative for redness.  Respiratory:  Negative for cough, chest tightness and shortness of breath.   Cardiovascular:  Negative for chest pain and palpitations.  Gastrointestinal:  Negative for abdominal pain, constipation, diarrhea, nausea and vomiting.  Genitourinary:  Negative for dysuria and frequency.  Musculoskeletal:  Positive for arthralgias and back pain. Negative for joint swelling and neck pain.  Skin:  Negative for rash.  Neurological: Negative.  Negative for tremors and numbness.  Hematological:  Negative for adenopathy. Does not bruise/bleed easily.  Psychiatric/Behavioral:  Negative for behavioral problems (Depression), sleep disturbance and suicidal ideas. The patient is not nervous/anxious.    Vital Signs: BP (!) 156/85    Pulse 93    Temp 98 F (36.7 C)    Resp 16    Ht 5\' 5"  (1.651 m)    Wt 199 lb 6.4 oz (90.4 kg)    SpO2 99%    BMI 33.18 kg/m    Physical Exam Constitutional:      Appearance: Normal appearance.  HENT:     Head: Normocephalic and atraumatic.     Nose: Nose normal.     Mouth/Throat:     Mouth: Mucous membranes are moist.     Pharynx: No posterior oropharyngeal erythema.  Eyes:     Extraocular Movements: Extraocular movements intact.     Pupils: Pupils are equal, round, and reactive to light.  Cardiovascular:     Rate and Rhythm: Normal rate and regular rhythm.     Pulses: Normal pulses.     Heart sounds: Normal heart sounds.  Pulmonary:     Effort: Pulmonary effort is normal.     Breath sounds: Normal breath sounds.  Musculoskeletal:        General: Normal range of motion.  Skin:    General: Skin is warm and dry.  Neurological:     General: No focal deficit present.      Mental Status: She is alert.  Psychiatric:        Mood and Affect: Mood normal.        Behavior: Behavior normal.       Assessment/Plan: 1. Essential hypertension Elevated in office due to not taking med yet this morning and will do so. States it has been well controlled at home. - losartan (COZAAR)  50 MG tablet; Take 1 tablet (50 mg total) by mouth daily.  Dispense: 90 tablet; Refill: 1  2. GAD (generalized anxiety disorder) May continue prozac as before - FLUoxetine (PROZAC) 20 MG tablet; Take 1/2 tablet by mouth daily.  Dispense: 90 tablet; Refill: 1  3. Loosening of hardware in spine [facility]) Will refer to neurosurgery for further evaluation of fractured spinal hardware. Advised to call or go to ED if acute worsening or new symptoms. - Ambulatory referral to Neurosurgery  4. Hypothyroidism, unspecified type Will recheck labs - TSH+T4F+T3Free  5. Pure hypercholesterolemia - Lipid Panel With LDL/HDL Ratio  6. Vitamin D deficiency - VITAMIN D 25 Hydroxy (Vit-D Deficiency, Fractures)  7. Other fatigue - CBC w/Diff/Platelet - Comprehensive metabolic panel   General Counseling: anneka studer understanding of the findings of todays visit and agrees with plan of treatment. I have discussed any further diagnostic evaluation that may be needed or ordered today. We also reviewed her medications today. she has been encouraged to call the office with any questions or concerns that should arise related to todays visit.    Orders Placed This Encounter  Procedures   CBC w/Diff/Platelet   Comprehensive metabolic panel   Lipid Panel With LDL/HDL Ratio   TSH+T4F+T3Free   VITAMIN D 25 Hydroxy (Vit-D Deficiency, Fractures)   Ambulatory referral to Neurosurgery    Meds ordered this encounter  Medications   FLUoxetine (PROZAC) 20 MG tablet    Sig: Take 1/2 tablet by mouth daily.    Dispense:  90 tablet    Refill:  1   losartan (COZAAR) 50 MG tablet    Sig: Take 1 tablet  (50 mg total) by mouth daily.    Dispense:  90 tablet    Refill:  1    This patient was seen by Drema Dallas, PA-C in collaboration with Dr. Clayborn Bigness as a part of collaborative care agreement.   Total time spent:30 Minutes Time spent includes review of chart, medications, test results, and follow up plan with the patient.      Dr Lavera Guise Internal medicine

## 2021-04-14 ENCOUNTER — Telehealth: Payer: Self-pay

## 2021-04-14 NOTE — Telephone Encounter (Signed)
Neurosurgery referral sent via [facility] to [facility]

## 2021-05-19 NOTE — Telephone Encounter (Signed)
Appointment> 06/10/21 @ 9:00-Deanna Howard

## 2021-06-02 ENCOUNTER — Other Ambulatory Visit: Payer: Self-pay

## 2021-06-02 MED ORDER — FLUOXETINE HCL 10 MG PO CAPS: 10.0000 mg | ORAL_CAPSULE | Freq: Every day | ORAL | 1 refills | Status: DC

## 2021-06-11 ENCOUNTER — Other Ambulatory Visit: Payer: Self-pay | Admitting: Neurosurgery

## 2021-06-11 ENCOUNTER — Other Ambulatory Visit (HOSPITAL_COMMUNITY): Payer: Self-pay | Admitting: Neurosurgery

## 2021-06-11 DIAGNOSIS — G959 Disease of spinal cord, unspecified: Secondary | ICD-10-CM

## 2021-06-19 ENCOUNTER — Encounter: Payer: Self-pay | Admitting: Internal Medicine

## 2021-06-19 ENCOUNTER — Other Ambulatory Visit: Payer: Self-pay

## 2021-06-19 ENCOUNTER — Ambulatory Visit
Admission: RE | Admit: 2021-06-19 | Discharge: 2021-06-19 | Disposition: A | Discharge status: Home / Self Care | Payer: 59 | Source: Ambulatory Visit | Attending: Internal Medicine | Admitting: Internal Medicine

## 2021-06-19 ENCOUNTER — Ambulatory Visit
Admission: RE | Admit: 2021-06-19 | Discharge: 2021-06-19 | Disposition: A | Discharge status: Home / Self Care | Payer: 59 | Attending: Internal Medicine | Admitting: Internal Medicine

## 2021-06-19 ENCOUNTER — Ambulatory Visit (INDEPENDENT_AMBULATORY_CARE_PROVIDER_SITE_OTHER): Payer: 59 | Admitting: Nurse Practitioner

## 2021-06-19 VITALS — BP 148/78 | HR 110 | Temp 97.6°F | Resp 16 | Ht 65.0 in | Wt 186.8 lb

## 2021-06-19 DIAGNOSIS — I10 Essential (primary) hypertension: Secondary | ICD-10-CM

## 2021-06-19 DIAGNOSIS — R0602 Shortness of breath: Secondary | ICD-10-CM

## 2021-06-19 DIAGNOSIS — F17200 Nicotine dependence, unspecified, uncomplicated: Secondary | ICD-10-CM | POA: Diagnosis not present

## 2021-06-19 DIAGNOSIS — I479 Paroxysmal tachycardia, unspecified: Secondary | ICD-10-CM

## 2021-06-19 NOTE — Progress Notes (Addendum)
[facility] ?[address] ?[place], [place] [address] ? ?Internal MEDICINE  ?Office Visit Note ? ?Patient Name: Deanna Howard ? [id number]  ?[id number] ? ?Date of Service: 06/20/2021 ? ?Chief Complaint  ?Patient presents with  ? Shortness of Breath  ?  Started within the last month, smokes 1/2 pack of cigarettes daily  ? Foot Swelling  ?  Feet swell up in the evenings  ? ? ?HPI ?Ajaya presents for an acute sick visit. ?Patient had called this morning due to concerns of shortness of breath which has worsened over a period of 1 month, patient is also complaining of swelling in her lower extremities by the end of the day. ?Patient denies any chest pain may be some pressure, however does have racing of the heart feels flushed at times. ?Patient also noticed this morning that she had a tender spot on the right side of her thyroid/neck. ?She is concerned about her weight loss as well she says she is not trying but has lost 13 pounds in the last couple of months, generalized weakness is present ?He recently was started on blood pressure medicine losartan 50 mg once a day, she did not go for her blood work ?Seen by neurosurgery, MRI scheduled in April.  ? ?Current Medication: ? ?Outpatient Encounter Medications as of 06/19/2021  ?Medication Sig  ? ALPRAZolam (XANAX) 0.25 MG tablet Take 0.25 mg by mouth at bedtime as needed for anxiety. Takes as needed for anxiety  ? FLUoxetine (PROZAC) 10 MG capsule Take 1 capsule (10 mg total) by mouth daily.  ? losartan (COZAAR) 50 MG tablet Take 1 tablet (50 mg total) by mouth daily.  ? TURMERIC PO Take 1,000 mg by mouth. Takes two to three a day  ? [DISCONTINUED] amoxicillin-clavulanate (AUGMENTIN) 875-125 MG tablet Take 1 tablet by mouth 2 (two) times daily. Take with food.  ? omeprazole (PRILOSEC) 20 MG capsule Take 1 capsule (20 mg total) by mouth daily for 28 days.  ? ?No facility-administered encounter medications on file as of 06/19/2021.  ? ? ? ? ?Medical History: ?Past  Medical History:  ?Diagnosis Date  ? Depression   ? Femoroacetabular impingement of both hips   ? History of duodenal ulcer   ? Hyperlipidemia   ? Osteoarthritis   ? Thyroid disease   ? ? ? ?Vital Signs: ?BP (!) 148/78   Pulse (!) 110   Temp 97.6 ?F (36.4 ?C)   Resp 16   Ht '5\' 5"'$  (1.651 m)   Wt 186 lb 12.8 oz (84.7 kg)   SpO2 97%   BMI 31.09 kg/m?  ? ? ?Review of Systems  ?Constitutional:  Positive for fatigue. Negative for chills, fever and unexpected weight change.  ?HENT:  Positive for congestion, ear pain and sinus pressure. Negative for postnasal drip, rhinorrhea, sneezing and sore throat.   ?     Neck tenderness.   ?Eyes:  Negative for redness.  ?Respiratory:  Positive for shortness of breath. Negative for cough and chest tightness.   ?Cardiovascular:  Negative for chest pain and palpitations.  ?Gastrointestinal:  Negative for abdominal pain, constipation, diarrhea, nausea and vomiting.  ?Genitourinary:  Negative for dysuria and frequency.  ?Musculoskeletal:  Negative for arthralgias, back pain, joint swelling and neck pain.  ?Skin:  Negative for rash.  ?Neurological: Negative.  Negative for tremors and numbness.  ?Hematological:  Negative for adenopathy. Does not bruise/bleed easily.  ?Psychiatric/Behavioral:  Negative for behavioral problems (Depression), sleep disturbance and suicidal ideas. The patient is not  nervous/anxious.   ? ?Physical Exam ?Vitals reviewed.  ?Constitutional:   ?   General: She is not in acute distress. ?   Appearance: Normal appearance. She is obese. She is ill-appearing.  ?HENT:  ?   Head: Normocephalic and atraumatic.  ?   Right Ear: Tympanic membrane, ear canal and external ear normal. There is no impacted cerumen.  ?   Left Ear: Tympanic membrane, ear canal and external ear normal. There is no impacted cerumen.  ?   Nose: Nose normal.  ?   Mouth/Throat:  ?   Mouth: Mucous membranes are moist.  ?   Pharynx: Oropharynx is clear. No posterior oropharyngeal erythema.  ?Eyes:  ?    Extraocular Movements: Extraocular movements intact.  ?   Pupils: Pupils are equal, round, and reactive to light.  ?Neck:  ?   Comments: Tenderness on palpation of right side of neck/thyroid noted.  ?Cardiovascular:  ?   Rate and Rhythm: Regular rhythm. Tachycardia present.  ?   Pulses: Normal pulses.  ?   Heart sounds: Normal heart sounds.  ?Pulmonary:  ?   Effort: Pulmonary effort is normal.  ?   Breath sounds: Normal breath sounds. No decreased breath sounds, wheezing or rhonchi.  ?   Comments: Although, no increased work of breathing or use of accessory muscles was observed, she did seem short of breath in conversation after 10-20 words.  ?Musculoskeletal:  ?   Cervical back: No tenderness.  ?   Right lower leg: No tenderness. 1+ Edema present.  ?   Left lower leg: No tenderness. 1+ Edema present.  ?   Comments: Slight edema with no pitting  ?Lymphadenopathy:  ?   Cervical: No cervical adenopathy.  ?Skin: ?   Capillary Refill: Capillary refill takes less than 2 seconds.  ?Neurological:  ?   General: No focal deficit present.  ?   Mental Status: She is alert and oriented to person, place, and time.  ?Psychiatric:     ?   Mood and Affect: Mood normal.     ?   Behavior: Behavior normal.  ? ? ? ? ?Assessment/Plan: ?1. SOB (shortness of breath) on exertion ?Basic diagnostic evaluation in the office included  ?- 6 minute walk ( negative for Hypoxia ?- PR BREATHING CAPACITY TEST, decreased FEV1  ?- EKG 12-Lead, tachycardia with widened QRS complex, possible WPW pattern, no acute changes  ?- CBC with Differential/Platelet ?- Lipid Panel With LDL/HDL Ratio ?- TSH ?- T4, free ?- Comprehensive metabolic panel ?- DG Chest 2 View; Future normal, consider CT chest, pending CXR results ?Samples of Anoro one puff qd is given  ? ?2. Uncontrolled hypertension ?Monitor [facility] at [facility] all labs and address accordingly  ? ?3. Tachycardia, paroxysmal ([place]le) ?Might need to change Losartan to B blocker, abnormal EKG, will need echo   ? ?4. Nicotine use disorder ?Encouraged smoking cessation, Might need  CT chest  ? ? ?General Counseling: latravia southgate understanding of the findings of todays visit and agrees with plan of treatment. I have discussed any further diagnostic evaluation that may be needed or ordered today. We also reviewed her medications today. she has been encouraged to call the office with any questions or concerns that should arise related to todays visit. ? ? ? ?Counseling: ? ? ? ?Orders Placed This Encounter  ?Procedures  ? DG Chest 2 View  ? CBC with Differential/Platelet  ? Lipid Panel With LDL/HDL Ratio  ? TSH  ? T4, free  ?  Comprehensive metabolic panel  ? EKG 12-Lead  ? 6 minute walk  ? PR BREATHING CAPACITY TEST  ? ? ?Return in about 1 week (around 06/26/2021) for F/U with [facility]. ? ? ?[place] Controlled Substance Database was reviewed by me for overdose risk score (ORS) ? ?Time spent:45 Minutes ?Time spent with patient included reviewing progress notes, labs, imaging studies, and discussing plan for follow up.  ?This office visit involved High level complex medical decision making and critical thinking with direct consultation with supervising physician Dr. Clayborn Bigness.  ? ?This patient was seen by Jonetta Osgood, FNP-C in collaboration with Dr. Clayborn Bigness as a part of collaborative care agreement. ? ?Harutyun Monteverde R. Valetta Fuller, MSN, FNP-C ?Internal Medicine ?

## 2021-06-20 ENCOUNTER — Encounter: Payer: Self-pay | Admitting: Internal Medicine

## 2021-06-20 LAB — CBC WITH DIFFERENTIAL/PLATELET
Basophils Absolute: 0 10*3/uL (ref 0.0–0.2)
Basos: 0 %
EOS (ABSOLUTE): 0.1 10*3/uL (ref 0.0–0.4)
Eos: 3 %
Hematocrit: 39.4 % (ref 34.0–46.6)
Hemoglobin: 13 g/dL (ref 11.1–15.9)
Immature Grans (Abs): 0 10*3/uL (ref 0.0–0.1)
Immature Granulocytes: 0 %
Lymphocytes Absolute: 1.9 10*3/uL (ref 0.7–3.1)
Lymphs: 36 %
MCH: 28.1 pg (ref 26.6–33.0)
MCHC: 33 g/dL (ref 31.5–35.7)
MCV: 85 fL (ref 79–97)
Monocytes Absolute: 0.9 10*3/uL (ref 0.1–0.9)
Monocytes: 18 %
Neutrophils Absolute: 2.2 10*3/uL (ref 1.4–7.0)
Neutrophils: 43 %
Platelets: 224 10*3/uL (ref 150–450)
RBC: 4.63 x10E6/uL (ref 3.77–5.28)
RDW: 11.5 % — ABNORMAL LOW (ref 11.7–15.4)
WBC: 5.2 10*3/uL (ref 3.4–10.8)

## 2021-06-20 LAB — COMPREHENSIVE METABOLIC PANEL
ALT: 47 IU/L — ABNORMAL HIGH (ref 0–32)
AST: 33 IU/L (ref 0–40)
Albumin/Globulin Ratio: 1.3 (ref 1.2–2.2)
Albumin: 3.7 g/dL — ABNORMAL LOW (ref 3.8–4.9)
Alkaline Phosphatase: 94 IU/L (ref 44–121)
BUN/Creatinine Ratio: 26 — ABNORMAL HIGH (ref 9–23)
BUN: 11 mg/dL (ref 6–24)
Bilirubin Total: 0.3 mg/dL (ref 0.0–1.2)
CO2: 19 mmol/L — ABNORMAL LOW (ref 20–29)
Calcium: 9.8 mg/dL (ref 8.7–10.2)
Chloride: 105 mmol/L (ref 96–106)
Creatinine, Ser: 0.43 mg/dL — ABNORMAL LOW (ref 0.57–1.00)
Globulin, Total: 2.8 g/dL (ref 1.5–4.5)
Glucose: 87 mg/dL (ref 70–99)
Potassium: 3.9 mmol/L (ref 3.5–5.2)
Sodium: 144 mmol/L (ref 134–144)
Total Protein: 6.5 g/dL (ref 6.0–8.5)
eGFR: 113 mL/min/{1.73_m2} (ref >59)

## 2021-06-20 LAB — LIPID PANEL WITH LDL/HDL RATIO
Cholesterol, Total: 152 mg/dL (ref 100–199)
HDL: 44 mg/dL (ref >39)
LDL Chol Calc (NIH): 89 mg/dL (ref 0–99)
LDL/HDL Ratio: 2 ratio (ref 0.0–3.2)
Triglycerides: 102 mg/dL (ref 0–149)
VLDL Cholesterol Cal: 19 mg/dL (ref 5–40)

## 2021-06-20 LAB — TSH: TSH: <0.005 u[IU]/mL — ABNORMAL LOW (ref 0.450–4.500)

## 2021-06-20 LAB — T4, FREE: Free T4: 4.52 ng/dL — ABNORMAL HIGH (ref 0.82–1.77)

## 2021-06-20 NOTE — Addendum Note (Signed)
Addended by: Jonetta Osgood on: 06/20/2021 08:11 AM ? ? Modules accepted: Level of Service ? ?

## 2021-06-23 ENCOUNTER — Encounter: Payer: Self-pay | Admitting: Physician Assistant

## 2021-06-25 ENCOUNTER — Telehealth: Payer: Self-pay

## 2021-06-25 ENCOUNTER — Ambulatory Visit (INDEPENDENT_AMBULATORY_CARE_PROVIDER_SITE_OTHER): Payer: 59 | Admitting: Internal Medicine

## 2021-06-25 ENCOUNTER — Other Ambulatory Visit: Payer: Self-pay

## 2021-06-25 ENCOUNTER — Encounter: Payer: Self-pay | Admitting: Internal Medicine

## 2021-06-25 VITALS — BP 154/73 | HR 99 | Temp 98.3°F | Resp 16 | Ht 65.0 in | Wt 186.0 lb

## 2021-06-25 DIAGNOSIS — R7989 Other specified abnormal findings of blood chemistry: Secondary | ICD-10-CM | POA: Diagnosis not present

## 2021-06-25 DIAGNOSIS — R0602 Shortness of breath: Secondary | ICD-10-CM | POA: Diagnosis not present

## 2021-06-25 DIAGNOSIS — I10 Essential (primary) hypertension: Secondary | ICD-10-CM | POA: Diagnosis not present

## 2021-06-25 DIAGNOSIS — Z7689 Persons encountering health services in other specified circumstances: Secondary | ICD-10-CM

## 2021-06-25 DIAGNOSIS — R5383 Other fatigue: Secondary | ICD-10-CM

## 2021-06-25 DIAGNOSIS — E079 Disorder of thyroid, unspecified: Secondary | ICD-10-CM

## 2021-06-25 DIAGNOSIS — F411 Generalized anxiety disorder: Secondary | ICD-10-CM | POA: Diagnosis not present

## 2021-06-25 DIAGNOSIS — E039 Hypothyroidism, unspecified: Secondary | ICD-10-CM

## 2021-06-25 DIAGNOSIS — K257 Chronic gastric ulcer without hemorrhage or perforation: Secondary | ICD-10-CM

## 2021-06-25 DIAGNOSIS — M159 Polyosteoarthritis, unspecified: Secondary | ICD-10-CM

## 2021-06-25 DIAGNOSIS — R030 Elevated blood-pressure reading, without diagnosis of hypertension: Secondary | ICD-10-CM

## 2021-06-25 MED ORDER — ALPRAZOLAM 0.25 MG PO TABS: ORAL_TABLET | ORAL | 1 refills | Status: DC

## 2021-06-25 MED ORDER — METOPROLOL TARTRATE 25 MG PO TABS: 25.0000 mg | ORAL_TABLET | Freq: Two times a day (BID) | ORAL | 3 refills | Status: DC

## 2021-06-25 NOTE — Progress Notes (Signed)
[facility] ?[address] ?[place], [place] [address] ? ?Internal MEDICINE  ?Office Visit Note ? ?Patient Name: Deanna Howard ? [id number]  ?[id number] ? ?Date of Service: 06/26/2021 ? ?Chief Complaint  ?Patient presents with  ? Follow-up  ? Depression  ? Hyperlipidemia  ? ? ?HPI ? ?Patient is here for acute and sick visit ?She was seen last week due to complaints of excessive shortness of breath, palpitations and neck pain. ?She noticed that the symptoms have gotten worse over the last couple of months, she was also has been having cough and some chest pressure. ?She is a smoker, she has lost about 13 pounds in the last 2 months, not sleeping well at night either patient was continued on losartan on previous visit she is here to discuss labs she does have elevated T4. ?She was also given samples of Anoro, she thinks it has not helped her ? ? ?Current Medication: ?Outpatient Encounter Medications as of 06/25/2021  ?Medication Sig  ? FLUoxetine (PROZAC) 10 MG capsule Take 1 capsule (10 mg total) by mouth daily.  ? metoprolol tartrate (LOPRESSOR) 25 MG tablet Take 1 tablet (25 mg total) by mouth 2 (two) times daily.  ? sucralfate (CARAFATE) 1 g tablet TAKE 1 TABLET BY MOUTH FOUR TIMES DAILY BEFORE MEALS AND AT BEDTIME WHEN MY ULCER ACTS UP  ? TURMERIC PO Take 1,000 mg by mouth. Takes two to three a day  ? [DISCONTINUED] ALPRAZolam (XANAX) 0.25 MG tablet Take 0.25 mg by mouth at bedtime as needed for anxiety. Takes as needed for anxiety  ? [DISCONTINUED] losartan (COZAAR) 50 MG tablet Take 1 tablet (50 mg total) by mouth daily.  ? ALPRAZolam (XANAX) 0.25 MG tablet TAKE HALF TO ONE TAB 2 X DAY  ? omeprazole (PRILOSEC) 20 MG capsule Take 1 capsule (20 mg total) by mouth daily for 28 days.  ? ?No facility-administered encounter medications on file as of 06/25/2021.  ? ? ?Surgical History: ?Past Surgical History:  ?Procedure Laterality Date  ? APPENDECTOMY    ? CHOLECYSTECTOMY    ? COLON SURGERY    ? removed tumor  from colon  ? COLONOSCOPY    ? HIP SURGERY    ? cartiledge repair, shaving of the bone.  ? ROTATOR CUFF REPAIR    ? SPINAL FUSION    ? UPPER GASTROINTESTINAL ENDOSCOPY    ? ? ?Medical History: ?Past Medical History:  ?Diagnosis Date  ? Depression   ? Femoroacetabular impingement of both hips   ? History of duodenal ulcer   ? Hyperlipidemia   ? Osteoarthritis   ? Thyroid disease   ? ? ?Family History: ?Family History  ?Problem Relation Age of Onset  ? Neuropathy Mother   ? Hypertension Mother   ? Heart disease Father   ? Kidney disease Father   ? Hypertension Father   ? Heart attack Father   ? COPD Father   ? Hypertension Brother   ? Heart disease Brother   ? Heart disease Brother   ? Hypertension Brother   ? Heart disease Brother   ? Hypertension Brother   ? ? ?Social History  ? ?Socioeconomic History  ? Marital status: Widowed  ?  Spouse name: Not on file  ? Number of children: Not on file  ? Years of education: Not on file  ? Highest education level: Not on file  ?Occupational History  ? Not on file  ?Tobacco Use  ? Smoking status: Every Day  ?  Packs/day: 1.00  ?  Types: Cigarettes  ? Smokeless tobacco: Never  ? Tobacco comments:  ?  Smokes 1/2 pack daily  ?Substance and Sexual Activity  ? Alcohol use: Yes  ?  Alcohol/week: 3.0 standard drinks  ?  Types: 3 Cans of beer per week  ?  Comment: 3-4 beers a couple times a week  ? Drug use: Never  ? Sexual activity: Not on file  ?Other Topics Concern  ? Not on file  ?Social History Narrative  ? Not on file  ? ?Social Determinants of Health  ? ?Financial Resource Strain: Not on file  ?Food Insecurity: Not on file  ?Transportation Needs: Not on file  ?Physical Activity: Not on file  ?Stress: Not on file  ?Social Connections: Not on file  ?Intimate Partner Violence: Not on file  ? ? ? ? ?Review of Systems  ?Constitutional:  Negative for chills, fatigue and unexpected weight change.  ?HENT:  Negative for congestion, postnasal drip, rhinorrhea, sneezing and sore throat.    ?Eyes:  Negative for redness.  ?Respiratory:  Positive for shortness of breath. Negative for cough and chest tightness.   ?Cardiovascular:  Positive for chest pain and palpitations.  ?Gastrointestinal:  Negative for abdominal pain, constipation, diarrhea, nausea and vomiting.  ?Genitourinary:  Negative for dysuria and frequency.  ?Musculoskeletal:  Negative for arthralgias, back pain, joint swelling and neck pain.  ?Skin:  Negative for rash.  ?Neurological: Negative.  Negative for tremors and numbness.  ?Hematological:  Negative for adenopathy. Does not bruise/bleed easily.  ?Psychiatric/Behavioral:  Positive for sleep disturbance. Negative for behavioral problems (Depression) and suicidal ideas. The patient is nervous/anxious.   ? ?Vital Signs: ?BP (!) 154/73   Pulse 99   Temp 98.3 ?F (36.8 ?C)   Resp 16   Ht 5' 5"  (1.651 m)   Wt 186 lb (84.4 kg)   SpO2 96%   BMI 30.95 kg/m?  ? ? ?Physical Exam ?Constitutional:   ?   Appearance: Normal appearance.  ?HENT:  ?   Head: Normocephalic and atraumatic.  ?   Nose: Nose normal.  ?   Mouth/Throat:  ?   Mouth: Mucous membranes are moist.  ?   Pharynx: No posterior oropharyngeal erythema.  ?Eyes:  ?   Extraocular Movements: Extraocular movements intact.  ?   Pupils: Pupils are equal, round, and reactive to light.  ?Neck:  ?   Comments: Small lump is noticed to the right side of her neck ?Cardiovascular:  ?   Rate and Rhythm: Tachycardia present.  ?   Pulses: Normal pulses.  ?   Heart sounds: Normal heart sounds.  ?Pulmonary:  ?   Effort: Pulmonary effort is normal.  ?   Breath sounds: Wheezing present.  ?Musculoskeletal:  ?   Cervical back: Tenderness present.  ?Neurological:  ?   General: No focal deficit present.  ?   Mental Status: She is alert.  ?Psychiatric:     ?   Mood and Affect: Mood normal.     ?   Behavior: Behavior normal.  ? ? ? ? ? ?Assessment/Plan: ?1. SOB (shortness of breath) ?Patient has worsening shortness of breath, not improved after using inhaler  she continues to be a smoker strong family history of CAD, spirometry was also abnormal on previous visit, will order CT chest, if negative might need cardiac evaluation ?- CT Chest Wo Contrast; Future ? ?2. Elevated serum free T4 level ?Patient has elevated free T4 levels she has been on [employer] Thyroid in the past however denies  taking it at the moment, she will need further diagnostics ?- Thyroglobulin Level ?- Thyroid peroxidase antibody ?- Sed Rate (ESR) ?- ANA w/Reflex if Positive; Future ? ?3. GAD (generalized anxiety disorder) ?Patient might have hyperthyroidism we will treat symptomatically add alprazolam as needed ?- ALPRAZolam (XANAX) 0.25 MG tablet; TAKE HALF TO ONE TAB 2 X DAY  Dispense: 45 tablet; Refill: 1 ? ?4. Benign hypertension ?Since patient has tachycardia.  Her losartan and add metoprolol twice a day ?- metoprolol tartrate (LOPRESSOR) 25 MG tablet; Take 1 tablet (25 mg total) by mouth 2 (two) times daily.  Dispense: 180 tablet; Refill: 3 ?- ALPRAZolam (XANAX) 0.25 MG tablet; TAKE HALF TO ONE TAB 2 X DAY  Dispense: 45 tablet; Refill: 1 ? ?5. Other fatigue ?Continue to monitor ?- Thyroglobulin Level ?- Thyroid peroxidase antibody ?- CT Chest Wo Contrast; Future ? ?6. Thyroid mass ?Tenderness on her thyroid, and mass on the right side we will get CT of her neck ?- CT Soft Tissue Neck W Contrast; Future  ? ?General Counseling: ollie delano understanding of the findings of todays visit and agrees with plan of treatment. I have discussed any further diagnostic evaluation that may be needed or ordered today. We also reviewed her medications today. she has been encouraged to call the office with any questions or concerns that should arise related to todays visit. ?Assessment and plan is discussed with the patient she agrees with all the diagnostics ? ? ?Orders Placed This Encounter  ?Procedures  ? CT Soft Tissue Neck W Contrast  ? CT Chest Wo Contrast  ? Thyroglobulin Level  ? Thyroid peroxidase  antibody  ? Sed Rate (ESR)  ? ANA w/Reflex if Positive  ? ? ?Meds ordered this encounter  ?Medications  ? metoprolol tartrate (LOPRESSOR) 25 MG tablet  ?  Sig: Take 1 tablet (25 mg total) by mouth 2 (two) times dail

## 2021-06-25 NOTE — Telephone Encounter (Signed)
Routine CT of soft tissue neck & chest ordered. Printed. Gave to Titania-Toni ?

## 2021-06-26 ENCOUNTER — Encounter: Payer: Self-pay | Admitting: Internal Medicine

## 2021-07-02 ENCOUNTER — Telehealth: Payer: Self-pay

## 2021-07-02 MED ORDER — MELOXICAM 15 MG PO TABS: 15.0000 mg | ORAL_TABLET | Freq: Every day | ORAL | 3 refills | Status: DC

## 2021-07-02 NOTE — Telephone Encounter (Signed)
Pt called and was advising that she is having more increased swelling in her feet and has no energy and increased SOB.  Going  on last 3 days. She feels it is her thyroid and she advised she seen the labs and they are abnormal ?

## 2021-07-02 NOTE — Telephone Encounter (Signed)
Spoke to Dr Humphrey Rolls and she advised that pt is possibly having thyroiditis and we are waiting on the CT scan to be done.  For now we are sending in Meloxicam 15 mg to pharmacy and I advised pt that we sent rx to pharmacy, and I informed pt that if she feels things get any worse and she can't wait to go to the ER ?

## 2021-07-06 LAB — THYROGLOBULIN LEVEL: Thyroglobulin (TG-RIA): 329 ng/mL — ABNORMAL HIGH

## 2021-07-06 LAB — SEDIMENTATION RATE: Sed Rate: 28 mm/hr (ref 0–40)

## 2021-07-06 LAB — THYROID PEROXIDASE ANTIBODY: Thyroperoxidase Ab SerPl-aCnc: 346 IU/mL — ABNORMAL HIGH (ref 0–34)

## 2021-07-07 ENCOUNTER — Telehealth: Payer: Self-pay

## 2021-07-07 ENCOUNTER — Emergency Department: Payer: 59

## 2021-07-07 ENCOUNTER — Other Ambulatory Visit: Payer: Self-pay

## 2021-07-07 ENCOUNTER — Emergency Department
Admission: EM | Admit: 2021-07-07 | Discharge: 2021-07-07 | Disposition: A | Discharge status: Home / Self Care | Payer: 59 | Attending: Emergency Medicine | Admitting: Emergency Medicine

## 2021-07-07 DIAGNOSIS — F17210 Nicotine dependence, cigarettes, uncomplicated: Secondary | ICD-10-CM | POA: Insufficient documentation

## 2021-07-07 DIAGNOSIS — Z79899 Other long term (current) drug therapy: Secondary | ICD-10-CM | POA: Diagnosis not present

## 2021-07-07 DIAGNOSIS — I509 Heart failure, unspecified: Secondary | ICD-10-CM | POA: Diagnosis not present

## 2021-07-07 DIAGNOSIS — R0602 Shortness of breath: Secondary | ICD-10-CM | POA: Diagnosis present

## 2021-07-07 LAB — CBC
HCT: 35.4 % — ABNORMAL LOW (ref 36.0–46.0)
Hemoglobin: 11.5 g/dL — ABNORMAL LOW (ref 12.0–15.0)
MCH: 27.6 pg (ref 26.0–34.0)
MCHC: 32.5 g/dL (ref 30.0–36.0)
MCV: 84.9 fL (ref 80.0–100.0)
Platelets: 172 10*3/uL (ref 150–400)
RBC: 4.17 MIL/uL (ref 3.87–5.11)
RDW: 12.3 % (ref 11.5–15.5)
WBC: 4.7 10*3/uL (ref 4.0–10.5)
nRBC: 0 % (ref 0.0–0.2)

## 2021-07-07 LAB — BASIC METABOLIC PANEL
Anion gap: 7 (ref 5–15)
BUN: 14 mg/dL (ref 6–20)
CO2: 26 mmol/L (ref 22–32)
Calcium: 9.2 mg/dL (ref 8.9–10.3)
Chloride: 108 mmol/L (ref 98–111)
Creatinine, Ser: 0.42 mg/dL — ABNORMAL LOW (ref 0.44–1.00)
GFR, Estimated: >60 mL/min (ref >60)
Glucose, Bld: 107 mg/dL — ABNORMAL HIGH (ref 70–99)
Potassium: 3.7 mmol/L (ref 3.5–5.1)
Sodium: 141 mmol/L (ref 135–145)

## 2021-07-07 LAB — TROPONIN I (HIGH SENSITIVITY): Troponin I (High Sensitivity): 13 ng/L (ref <18)

## 2021-07-07 LAB — BRAIN NATRIURETIC PEPTIDE: B Natriuretic Peptide: 532.2 pg/mL — ABNORMAL HIGH (ref 0.0–100.0)

## 2021-07-07 MED ORDER — IOHEXOL 350 MG/ML SOLN
75.0000 mL | Freq: Once | INTRAVENOUS | Status: AC | PRN
  Administered 2021-07-07: 75 mL via INTRAVENOUS
  Filled 2021-07-07: qty 75

## 2021-07-07 MED ORDER — IPRATROPIUM-ALBUTEROL 0.5-2.5 (3) MG/3ML IN SOLN
3.0000 mL | Freq: Once | RESPIRATORY_TRACT | Status: AC
  Administered 2021-07-07: 3 mL via RESPIRATORY_TRACT
  Filled 2021-07-07: qty 3

## 2021-07-07 MED ORDER — IPRATROPIUM-ALBUTEROL 0.5-2.5 (3) MG/3ML IN SOLN
3.0000 mL | Freq: Once | RESPIRATORY_TRACT | Status: AC
Start: 2021-07-07 — End: 2021-07-07
  Administered 2021-07-07: 3 mL via RESPIRATORY_TRACT
  Filled 2021-07-07: qty 3

## 2021-07-07 MED ORDER — FUROSEMIDE 10 MG/ML IJ SOLN
40.0000 mg | Freq: Once | INTRAMUSCULAR | Status: AC
  Administered 2021-07-07: 40 mg via INTRAVENOUS
  Filled 2021-07-07: qty 4

## 2021-07-07 MED ORDER — METOPROLOL TARTRATE 5 MG/5ML IV SOLN
10.0000 mg | Freq: Once | INTRAVENOUS | Status: AC
  Administered 2021-07-07: 10 mg via INTRAVENOUS
  Filled 2021-07-07: qty 10

## 2021-07-07 MED ORDER — FUROSEMIDE 20 MG PO TABS: 20.0000 mg | ORAL_TABLET | Freq: Two times a day (BID) | ORAL | 11 refills | Status: DC

## 2021-07-07 MED ORDER — POTASSIUM CHLORIDE CRYS ER 20 MEQ PO TBCR: 20.0000 meq | EXTENDED_RELEASE_TABLET | Freq: Every day | ORAL | 1 refills | Status: DC

## 2021-07-07 NOTE — ED Notes (Signed)
See triage note  presents with a 2 month hx of cough and some SOB states sx's became worse over the weekend  slight wheezing noted on arrival to treatment room  afebrile on arrival ?

## 2021-07-07 NOTE — ED Triage Notes (Signed)
Pt c/o SOB with tightness in the chest for several days, worse today. ?

## 2021-07-07 NOTE — Telephone Encounter (Signed)
Patient scheduled for Ct Chest on 07/14/21 @ 8:40 Kp/tat ?

## 2021-07-07 NOTE — ED Provider Notes (Signed)
? ?[facility] ?Provider Note ? ? ? Event Date/Time  ? First MD Initiated Contact with Patient 07/07/21 0725   ?  (approximate) ? ? ?History  ? ?Shortness of Breath ? ? ?HPI ? ?Deanna Howard is a 56 y.o. female with history of hyperlipidemia, thyroid disease, cigarette smoking presents with complaints of shortness of breath.  Patient reports approximately 2 months of shortness of breath.  Has a CT scan ordered by her PCP in 2 weeks.  She reports over the last several days it seems to be worse, particularly at night when she is lying down.  No fevers or chills.  No significant cough.  Feels that she cannot take a full deep inspiration.  No pleurisy reported.  Occasional swelling in her feet bilaterally. ?  ? ? ?Physical Exam  ? ?Triage Vital Signs: ?ED Triage Vitals  ?Enc Vitals Group  ?   BP 07/07/21 0713 (!) 172/76  ?   Pulse Rate 07/07/21 0713 91  ?   Resp 07/07/21 0713 20  ?   Temp 07/07/21 0713 98 ?F (36.7 ?C)  ?   Temp Source 07/07/21 0713 Oral  ?   SpO2 07/07/21 0713 93 %  ?   Weight 07/07/21 0713 83.9 kg (185 lb)  ?   Height 07/07/21 0713 1.651 m ('5\' 5"'$ )  ?   Head Circumference --   ?   Peak Flow --   ?   Pain Score 07/07/21 0708 0  ?   Pain Loc --   ?   Pain Edu? --   ?   Excl. in [place]? --   ? ? ?Most recent vital signs: ?Vitals:  ? 07/07/21 0828 07/07/21 0926  ?BP: (!) 170/80 (!) 160/61  ?Pulse: 94 90  ?Resp: 20 20  ?Temp:    ?SpO2: 95% 96%  ? ? ? ?General: Awake, no distress.  ?CV:  Good peripheral perfusion.  ?Resp:  Normal effort.  No significant wheezing or rails ?Abd:  No distention.  ?Other:  No edema ? ? ?ED Results / Procedures / Treatments  ? ?Labs ?(all labs ordered are listed, but only abnormal results are displayed) ?Labs Reviewed  ?BASIC METABOLIC PANEL - Abnormal; Notable for the following components:  ?    Result Value  ? Glucose, Bld 107 (*)   ? Creatinine, Ser 0.42 (*)   ? All other components within normal limits  ?CBC - Abnormal; Notable for the following components:  ?  Hemoglobin 11.5 (*)   ? HCT 35.4 (*)   ? All other components within normal limits  ?BRAIN NATRIURETIC PEPTIDE - Abnormal; Notable for the following components:  ? B Natriuretic Peptide 532.2 (*)   ? All other components within normal limits  ?POC URINE PREG, ED  ?TROPONIN I (HIGH SENSITIVITY)  ? ? ? ?EKG ? ?ED ECG REPORT ?I, Lavonia Drafts, the attending physician, personally viewed and interpreted this ECG. ? ?Date: 07/07/2021 ? ?Rhythm: normal sinus rhythm ?QRS Axis: normal ?Intervals: normal ?ST/T Wave abnormalities: normal ?Narrative Interpretation: no evidence of acute ischemia ? ? ? ?RADIOLOGY ?Chest x-ray viewed interpreted by me, no acute abnormality ? ? ? ?PROCEDURES: ? ?Critical Care performed:  ? ?Procedures ? ? ?MEDICATIONS ORDERED IN ED: ?Medications  ?ipratropium-albuterol (DUONEB) 0.5-2.5 (3) MG/3ML nebulizer solution 3 mL (3 mLs Nebulization Given 07/07/21 0755)  ?ipratropium-albuterol (DUONEB) 0.5-2.5 (3) MG/3ML nebulizer solution 3 mL (3 mLs Nebulization Given 07/07/21 0755)  ?iohexol (OMNIPAQUE) 350 MG/ML injection 75 mL (75 mLs Intravenous Contrast  Given 07/07/21 0981)  ?furosemide (LASIX) injection 40 mg (40 mg Intravenous Given 07/07/21 0920)  ?metoprolol tartrate (LOPRESSOR) injection 10 mg (10 mg Intravenous Given 07/07/21 0922)  ? ? ? ?IMPRESSION / MDM / ASSESSMENT AND PLAN / ED COURSE  ?I reviewed the triage vital signs and the nursing notes. ? ? ?Patient with a history of smoking presents with gradually worsening shortness of breath.  Differential includes COPD, CHF, doubt pneumonia, less likely PE. ? ?Overall well-appearing here in the emergency department, exam is reassuring.  Chest x-ray without evidence of edema or pneumonia. ? ?We will treat with DuoNebs and reevaluate. ? ?No significant improvement after DuoNeb's, chest x-ray is clear, will send for CT angiography to rule out PE ? ?CT angiography results discussed with patient including thyroid nodule and possible thymoma that needs to be  followed up with PCP ? ?We discussed likely cause of her shortness of breath is CHF, offered admission for diuresis however the patient declined.  She would like to go home.  She did agree to IV Lasix here in the emergency department.   ? ?Patient diuresed nicely.  Discussed with Dr. Saunders Revel of cardiology, he will arrange for the patient to be seen in his office ideally this week ? ?Lasix and K-Dur prescribed ? ? ?  ? ? ?FINAL CLINICAL IMPRESSION(S) / ED DIAGNOSES  ? ?Final diagnoses:  ?Congestive heart failure, unspecified HF chronicity, unspecified heart failure type ([place])  ? ? ? ?Rx / DC Orders  ? ?ED Discharge Orders   ? ?      Ordered  ?  furosemide (LASIX) 20 MG tablet  2 times daily       ? 07/07/21 1023  ?  potassium chloride SA (KLOR-CON M) 20 MEQ tablet  Daily       ? 07/07/21 1023  ? ?  ?  ? ?  ? ? ? ?Note:  This document was prepared using Dragon voice recognition software and may include unintentional dictation errors. ?  ?Lavonia Drafts, MD ?07/07/21 1027 ? ?

## 2021-07-08 ENCOUNTER — Encounter (HOSPITAL_COMMUNITY): Payer: Self-pay | Admitting: Physician Assistant

## 2021-07-08 ENCOUNTER — Encounter: Payer: Self-pay | Admitting: Cardiovascular Disease

## 2021-07-08 ENCOUNTER — Ambulatory Visit (INDEPENDENT_AMBULATORY_CARE_PROVIDER_SITE_OTHER): Payer: 59 | Admitting: Cardiovascular Disease

## 2021-07-08 VITALS — BP 140/60 | HR 91 | Ht 65.0 in | Wt 182.5 lb

## 2021-07-08 DIAGNOSIS — E0590 Thyrotoxicosis, unspecified without thyrotoxic crisis or storm: Secondary | ICD-10-CM | POA: Diagnosis not present

## 2021-07-08 DIAGNOSIS — E041 Nontoxic single thyroid nodule: Secondary | ICD-10-CM

## 2021-07-08 DIAGNOSIS — I700 Atherosclerosis of aorta: Secondary | ICD-10-CM | POA: Diagnosis not present

## 2021-07-08 DIAGNOSIS — F17200 Nicotine dependence, unspecified, uncomplicated: Secondary | ICD-10-CM

## 2021-07-08 DIAGNOSIS — I509 Heart failure, unspecified: Secondary | ICD-10-CM

## 2021-07-08 DIAGNOSIS — I10 Essential (primary) hypertension: Secondary | ICD-10-CM

## 2021-07-08 MED ORDER — PROPRANOLOL HCL ER 60 MG PO CP24: 60.0000 mg | ORAL_CAPSULE | Freq: Every day | ORAL | 3 refills | Status: DC

## 2021-07-08 NOTE — Patient Instructions (Addendum)
Monitor blood pressure at home ? ?Medication Instructions:  ?Take metoprolol  as needed ?Start propanolol ER 60 mg once a day ? ?If you need a refill on your cardiac medications before your next appointment, please call your pharmacy.  ? ?Lab work: ? ?Your physician recommends that you return for lab work (BMP) in: 2 weeks ? ? ? ?Please return to our office on_____________________at______________am/pm  ? ?Testing/Procedures: ? ?Your physician has requested that you have an echocardiogram. Echocardiography is a painless test that uses sound waves to create images of your heart. It provides your doctor with information about the size and shape of your heart and how well your heart?s chambers and valves are working. This procedure takes approximately one hour. There are no restrictions for this procedure.  ? ?You have been referred to endocrinology. They will call you for this appointment.  ? ?Follow-Up: ?At [facility], you and your health needs are our priority.  As part of our continuing mission to provide you with exceptional heart care, we have created designated Provider Care Teams.  These Care Teams include your primary Cardiologist (physician) and Advanced Practice Providers (APPs -  Physician Assistants and Nurse Practitioners) who all work together to provide you with the care you need, when you need it. ? ?You will need a follow up appointment in 1 month ? ?Providers on your designated Care Team:   ?Murray Hodgkins, NP ?Christell Faith, PA-C ?Cadence Kathlen Mody, PA-C ? ?COVID-19 Vaccine Information can be found at: [email or web address] For questions related to vaccine distribution or appointments, please email [email or web address] or call [phone].  ? ?

## 2021-07-08 NOTE — Progress Notes (Signed)
Cardiology Office Note ? ?Date:  07/08/2021  ? ?ID:  Deanna Howard, DOB Dec 19, 1965, MRN [id number] ? ?PCP:  Mylinda Latina, PA-C  ? ?Chief Complaint  ?Patient presents with  ? New Patient (Initial Visit)  ?  Patient was at [facility] ER yesterday with new CHF. Patient c/o shortness of breath mostly when sleeping. Medications reviewed by the patient verbally.   ? ? ?HPI:  ?Deanna Howard is a 56 y.o. female with history of  ?hyperlipidemia,  ?thyroid disease, nodule 2.5 cm ?cigarette smoking , 1/2 ppd ?Presenting to the emergency room yesterday with with complaints of shortness of breath.  ?Who presents for new patient today for her shortness of breath ? ?Leg edema feb 2023, more 2 weeks ago ?1 week worsening SOB at night, waking at night ?Went to the Er yesterday for worsening shortness of breath ?In the emergency room reported no fevers or chills.  No significant cough.  Feels that she cannot take a full deep inspiration.  No pleurisy reported. swelling in her feet bilaterally. ? ?Blood pressure elevated in the emergency room 172/76 ?Given IV Lasix 40 mg in the emergency room ?Discharged on Lasix 20 twice daily with potassium 20 daily ? ?CT scan images pulled up and reviewed, 2.5 cm thyroid nodule noted ?Minimal aortic atherosclerosis ?Small pleural effusions, pulmonary edema ? ?Denies eating out frequently ?Taking metoprolol twice a day ? ?EKG personally reviewed by myself on todays visit ?Normal sinus rhythm rate 91 bpm no significant ST-T wave changes ? ?PMH:   has a past medical history of Depression, Femoroacetabular impingement of both hips, History of duodenal ulcer, Hyperlipidemia, Osteoarthritis, and Thyroid disease. ? ?PSH:    ?Past Surgical History:  ?Procedure Laterality Date  ? APPENDECTOMY    ? CHOLECYSTECTOMY    ? COLON SURGERY    ? removed tumor from colon  ? COLONOSCOPY    ? HIP SURGERY    ? cartiledge repair, shaving of the bone.  ? ROTATOR CUFF REPAIR    ? SPINAL FUSION    ? UPPER GASTROINTESTINAL  ENDOSCOPY    ? ? ?Current Outpatient Medications  ?Medication Sig Dispense Refill  ? ALPRAZolam (XANAX) 0.25 MG tablet TAKE HALF TO ONE TAB 2 X DAY (Patient taking differently: Take 0.125-0.25 mg by mouth 2 (two) times daily as needed for anxiety.) 45 tablet 1  ? aspirin-acetaminophen-caffeine (EXCEDRIN MIGRAINE) 250-250-65 MG tablet Take 2 tablets by mouth every 6 (six) hours as needed for headache.    ? FLUoxetine (PROZAC) 10 MG capsule Take 1 capsule (10 mg total) by mouth daily. 90 capsule 1  ? furosemide (LASIX) 20 MG tablet Take 1 tablet (20 mg total) by mouth 2 (two) times daily. 30 tablet 11  ? meloxicam (MOBIC) 15 MG tablet Take 1 tablet (15 mg total) by mouth daily. 30 tablet 3  ? metoprolol tartrate (LOPRESSOR) 25 MG tablet Take 1 tablet (25 mg total) by mouth 2 (two) times daily. (Patient taking differently: Take 12.5-25 mg by mouth See admin instructions. Taking 12.5 mg in the Am and 25 mg in the evening) 180 tablet 3  ? potassium chloride SA (KLOR-CON M) 20 MEQ tablet Take 1 tablet (20 mEq total) by mouth daily. 30 tablet 1  ? propranolol ER (INDERAL LA) 60 MG 24 hr capsule Take 1 capsule (60 mg total) by mouth daily. 90 capsule 3  ? sucralfate (CARAFATE) 1 g tablet Take 1 g by mouth daily as needed (nausea, abdominal pain).    ? ?No current facility-administered  medications for this visit.  ? ? ? ?Allergies:   Patient has no known allergies.  ? ?Social History:  The patient  reports that she has been smoking cigarettes. She has a 30.00 pack-year smoking history. She has never used smokeless tobacco. She reports current alcohol use of about 3.0 standard drinks per week. She reports that she does not use drugs.  ? ?Family History:   family history includes COPD in her father; Heart attack in her father; Heart disease in her brother, brother, brother, and father; Hypertension in her brother, brother, brother, father, and mother; Kidney disease in her father; Neuropathy in her mother.  ? ? ?Review of  Systems: ?Review of Systems  ?Constitutional: Negative.   ?HENT: Negative.    ?Respiratory:  Positive for shortness of breath.   ?Cardiovascular:  Positive for leg swelling.  ?Gastrointestinal: Negative.   ?Musculoskeletal: Negative.   ?Neurological: Negative.   ?Psychiatric/Behavioral: Negative.    ?All other systems reviewed and are negative. ? ? ?PHYSICAL EXAM: ?VS:  BP 140/60 (BP Location: Right Arm, Patient Position: Sitting, Cuff Size: Normal)   Pulse 91   Ht '5\' 5"'$  (1.651 m)   Wt 82.8 kg   SpO2 98%   BMI 30.37 kg/m?  , BMI Body mass index is 30.37 kg/m?. ?GEN: Well nourished, well developed, in no acute distress ?HEENT: normal ?Neck: no JVD, carotid bruits, or masses ?Cardiac: RRR; no murmurs, rubs, or gallops, trivial edema  ?Respiratory:  clear to auscultation bilaterally, normal work of breathing ?GI: soft, nontender, nondistended, + BS ?MS: no deformity or atrophy ?Skin: warm and dry, no rash ?Neuro:  Strength and sensation are intact ?Psych: euthymic mood, full affect ? ? ?Recent Labs: ?06/19/2021: ALT 47; TSH <0.005 ?07/07/2021: B Natriuretic Peptide 532.2; BUN 14; Creatinine, Ser 0.42; Hemoglobin 11.5; Platelets 172; Potassium 3.7; Sodium 141  ? ? ?Lipid Panel ?Lab Results  ?Component Value Date  ? CHOL 152 06/19/2021  ? HDL 44 06/19/2021  ? [place] 89 06/19/2021  ? TRIG 102 06/19/2021  ? ?  ? ?Wt Readings from Last 3 Encounters:  ?07/08/21 82.8 kg  ?07/07/21 83.9 kg  ?06/25/21 84.4 kg  ?  ? ? ?ASSESSMENT AND PLAN: ? ?Problem List Items Addressed This Visit   ?None ?Visit Diagnoses   ? ? Congestive heart failure, unspecified HF chronicity, unspecified heart failure type ([place])    -  Primary  ? Relevant Medications  ? propranolol ER (INDERAL LA) 60 MG 24 hr capsule  ? Other Relevant Orders  ? EKG 12-Lead  ? ECHOCARDIOGRAM COMPLETE  ? Ambulatory referral to Endocrinology  ? Basic Metabolic Panel (BMET)  ? Aortic atherosclerosis ([place])      ? Relevant Medications  ? propranolol ER (INDERAL LA) 60 MG 24 hr  capsule  ? Hyperthyroidism      ? Relevant Medications  ? propranolol ER (INDERAL LA) 60 MG 24 hr capsule  ? Thyroid nodule      ? Relevant Medications  ? propranolol ER (INDERAL LA) 60 MG 24 hr capsule  ? Essential hypertension      ? Relevant Medications  ? propranolol ER (INDERAL LA) 60 MG 24 hr capsule  ? Smoker      ? ?  ? ?Acute diastolic CHF ?In the setting of high blood pressure, high fluid intake ?Recommended she continue Lasix 20 twice daily with potassium 20 daily, recheck of BMP in 2 to 3 weeks ?-Moderate salt intake, close monitoring of blood pressure ?Echocardiogram ordered for further evaluation,  cardiac function ? ?Hyperthyroidism ?Referral made to endocrine ?2.5 cm thyroid nodule noted on right.  She reports a fullness on the right side of her throat.  ?Low TSH on recent lab work ? ?Essential hypertension ?Recommend she stop metoprolol to tartrate, will change to propranolol ER 60 mg daily ?Higher doses if needed ? ?Smoker ?Smoking cessation recommended ?Trivial aortic atherosclerosis noted ? ? Total encounter time more than 60 minutes ? Greater than 50% was spent in counseling and coordination of care with the patient ? ? ?Signed, ?Esmond Plants, M.D., Ph.D. ?[facility] [place], [place] ?[phone] ?

## 2021-07-09 NOTE — Progress Notes (Addendum)
Anesthesia Chart Review: ?Same day workup ? ?Pt with new diagnosis of diastolic CHF. She was seen at the ED on 07/07/21 for 2 months of SOB and orthopnea.  CXR showed no evidence of edema or PNA. Troponin negative. BNP elevated at 532. CTA negative for PE but did show 2.5 cm R thyroid nodule as well as possible thymoma.  Of note, patient is already being worked up by her PCP for elevated T4 levels and low TSH and endocrinology eval is pending.  Her shortness of breath was ultimately felt due to diastolic CHF and she was offered admission for diuresis however she declined.  She did receive IV Lasix in the emergency department with appropriate diuresis.  She was discharged on Lasix and potassium.  She followed up the following day on 07/08/2021 with cardiologist Dr. Rockey Situ.  He recommended she continue Lasix 20 mg twice daily with potassium 20 mEq daily as well as moderate her salt intake.  Her metoprolol was discontinued and she started on propanolol ER 60 mg daily given her apparent hyperthyroid.  Echocardiogram was ordered but has not been completed yet. ? ?Neurosurgery notes from [facility] reviewed state the patient has had several months of progressive proximal lower extremity weakness as well as changes in hand dexterity and dropping things.  MRI cervical and thoracic spine was ordered due to concern for myelopathy.  This was ordered to be done with anesthesia due to patient's severe claustrophobia. ? ?I called and spoke with the patient at length about her new diastolic CHF, hypothyroid, and neuromuscular symptoms.  She reports that her issues related to her spine are all chronic, she does not report any acute changes.  She personally feels like her current issues are potentially more related to her hyperthyroid.  She states she has had a recent 20 pound unintentional weight loss and continues to have some difficulty sleeping.  She does state that her shortness of breath is somewhat improved after starting  Lasix and being switched to propanolol.  She previously worked in a preop clinic and understands the anesthesia concerns as they relate to hyperthyroidism and possible myasthenia gravis.  She expresses that she does not feel that her MRIs are urgent and she would like to have her hyperthyroidism and possible thymoma more fully evaluated prior to undergoing anesthesia.  I agree that this is a reasonable approach.  She does not yet have an appointment scheduled endocrinology.  I encouraged her to reach out to her PCP Dr. Chancy Milroy to make her aware of her recent ED visit and cardiology visit.  I also advised she asked Dr. Chancy Milroy to review the CTA.  She also has upcoming echo scheduled for 07/23/2021. ? ?MRIs will be rescheduled once patient has undergone evaluation of more acute issues.  I did notify [facility] scheduling as well as radiology scheduling. ? ?EKG 07/08/21: NSR. Rate 91. Possible LAE. Nonspecific ST and T wave abnormality.  ? ?CTA 07/07/21: ?IMPRESSION: ?1. No evidence of pulmonary embolism. ?2. Heart is borderline enlarged, and there is evidence of mild ?interstitial pulmonary edema and small bilateral pleural effusions. ?Clinical correlation for signs and symptoms of congestive heart ?failure is recommended. ?3. Small amount of amorphous soft tissue in the anterior mediastinum ?with some internal fatty stippling. This is strongly favored to be ?of thymic origin, potentially a thymoma. Clinical correlation for ?signs and symptoms of myasthenia gravis is recommended. ?4. 2.5 x 2.1 cm heterogeneously enhancing nodule in the right lobe ?of the thyroid gland. Recommend thyroid  US (ref: J Am Coll Radiol. ?2015 Feb;12(2): 143-50). ? ? ?Karoline Caldwell, PA-C ?[facility] Stay [facility]/Anesthesiology ?Phone [phone] ?07/09/2021 4:14 PM ? ?

## 2021-07-09 NOTE — Progress Notes (Signed)
Patient called back to confirm she had received instructions from [place] on her voicemail.  Will be coming in on 13th @ 0530.  Told her to hold her Lasix and Potassium on 13th which was added on Monday.  ? ?

## 2021-07-10 ENCOUNTER — Ambulatory Visit (HOSPITAL_COMMUNITY): Admission: RE | Admit: 2021-07-10 | Payer: 59 | Source: Ambulatory Visit

## 2021-07-10 ENCOUNTER — Ambulatory Visit (HOSPITAL_COMMUNITY): Admission: RE | Admit: 2021-07-10 | Payer: 59 | Source: Home / Self Care

## 2021-07-10 ENCOUNTER — Ambulatory Visit (HOSPITAL_COMMUNITY): Payer: 59

## 2021-07-10 ENCOUNTER — Telehealth: Payer: Self-pay

## 2021-07-10 ENCOUNTER — Encounter (HOSPITAL_COMMUNITY): Payer: Self-pay

## 2021-07-10 ENCOUNTER — Other Ambulatory Visit: Payer: Self-pay | Admitting: Physician Assistant

## 2021-07-10 DIAGNOSIS — E041 Nontoxic single thyroid nodule: Secondary | ICD-10-CM

## 2021-07-10 DIAGNOSIS — D4989 Neoplasm of unspecified behavior of other specified sites: Secondary | ICD-10-CM

## 2021-07-10 SURGERY — MRI WITH ANESTHESIA
Anesthesia: General

## 2021-07-10 NOTE — Telephone Encounter (Signed)
Pt advised that we put referral tat will call her back  ?

## 2021-07-11 ENCOUNTER — Telehealth: Payer: Self-pay

## 2021-07-11 NOTE — Telephone Encounter (Signed)
Endocrinology referral sent via [facility] to [place] ?

## 2021-07-14 ENCOUNTER — Ambulatory Visit: Payer: 59

## 2021-07-15 ENCOUNTER — Telehealth: Payer: Self-pay

## 2021-07-15 NOTE — Telephone Encounter (Signed)
Left vm and sent mychart message to confirm 07/21/21 appointment-Toni ?

## 2021-07-21 ENCOUNTER — Encounter: Payer: 59 | Admitting: Physician Assistant

## 2021-07-21 ENCOUNTER — Other Ambulatory Visit: Payer: 59

## 2021-07-22 ENCOUNTER — Other Ambulatory Visit: Payer: 59

## 2021-07-23 ENCOUNTER — Ambulatory Visit (INDEPENDENT_AMBULATORY_CARE_PROVIDER_SITE_OTHER): Payer: 59

## 2021-07-23 DIAGNOSIS — I509 Heart failure, unspecified: Secondary | ICD-10-CM

## 2021-07-23 LAB — ECHOCARDIOGRAM COMPLETE
AR max vel: 2.26 cm2
AV Area VTI: 2.36 cm2
AV Area mean vel: 2.31 cm2
AV Mean grad: 8 mmHg
AV Peak grad: 16 mmHg
AV Vena cont: 0.2 cm
Ao pk vel: 2 m/s
Area-P 1/2: 4.26 cm2
Calc EF: 70.2 %
S' Lateral: 2.7 cm
Single Plane A2C EF: 70.6 %
Single Plane A4C EF: 67.3 %

## 2021-07-24 LAB — BASIC METABOLIC PANEL
BUN/Creatinine Ratio: 28 — ABNORMAL HIGH (ref 9–23)
BUN: 11 mg/dL (ref 6–24)
CO2: 24 mmol/L (ref 20–29)
Calcium: 10.1 mg/dL (ref 8.7–10.2)
Chloride: 104 mmol/L (ref 96–106)
Creatinine, Ser: 0.4 mg/dL — ABNORMAL LOW (ref 0.57–1.00)
Glucose: 107 mg/dL — ABNORMAL HIGH (ref 70–99)
Potassium: 3.7 mmol/L (ref 3.5–5.2)
Sodium: 143 mmol/L (ref 134–144)
eGFR: 115 mL/min/{1.73_m2} (ref >59)

## 2021-07-25 ENCOUNTER — Telehealth: Payer: Self-pay | Admitting: Emergency Medicine

## 2021-07-25 NOTE — Telephone Encounter (Signed)
-----   Message from Minna Merritts, MD sent at 07/24/2021  6:14 PM EDT ----- ?Echocardiogram ?Normal left and right ventricular size and function ?Diastolic dysfunction noted ?No significant valvular heart disease ?

## 2021-07-25 NOTE — Telephone Encounter (Signed)
Called and spoke with patient. Results reviewed with patient, pt verbalized understanding,  questions (if any) answered.   ?

## 2021-08-11 NOTE — Progress Notes (Signed)
Cardiology Office Note ? ?Date:  08/12/2021  ? ?ID:  Deanna Howard, DOB 01/24/1966, MRN [id number] ? ?PCP:  Mylinda Latina, PA-C  ? ?Chief Complaint  ?Patient presents with  ? 1 month follow up   ?  Patient recently diagnosed with Graves' disease. Medications reviewed by the patient verbally. Patient c/o swelling in feet and shortness of breath.   ? ? ?HPI:  ?Deanna Howard is a 56 y.o. female with history of  ?hyperlipidemia,  ?thyroid disease, nodule 2.5 cm ?cigarette smoking , 1/2 ppd ?Presenting to the emergency room 2023 with shortness of breath.  ?Who presents for new patient today for her shortness of breath, tachypalpitations, hypothyroidism ? ?Last seen in clinic by myself April 2023 ?She reported having leg swelling, shortness of breath starting February 2023 ?Seen in the emergency room April 2023, blood pressure elevated, started on Lasix twice a day ?Felt to have pulmonary edema, high fluid and salt intake, started on Lasix twice a day with potassium ?On that visit was changed from metoprolol to tartrate to propranolol ER 60 daily for hyperthyroid ? ?Echocardiogram ordered at that time performed July 23, 2021 ?Normal LV function, no significant valvular heart disease ? ?Lab work reviewed ?TSH less than 0.01 in march 23 and may 23  ?free T4 -4.92 ?Creatinine 0.4 BUN 11 end of April ? ?Seen by endocrine Jul 29, 2021 started on methimazole 20 mg daily ? ?BP at home 956 systolic or higher ? ?U/s thyroid: 9 nodules ?Report pending ? ?No significant lower extremity edema, breathing is much better on current medication regimen ? ?EKG personally reviewed by myself on todays visit ?Normal sinus rhythm rate 77 bpm no significant ST-T wave changes ? ?Other past medical history reviewed ?CT scan images pulled up and reviewed, 2.5 cm thyroid nodule noted ?Minimal aortic atherosclerosis ?Small pleural effusions, pulmonary edema ? ? ?PMH:   has a past medical history of Depression, Femoroacetabular impingement of both  hips, History of duodenal ulcer, Hyperlipidemia, Osteoarthritis, and Thyroid disease. ? ?PSH:    ?Past Surgical History:  ?Procedure Laterality Date  ? APPENDECTOMY    ? CHOLECYSTECTOMY    ? COLON SURGERY    ? removed tumor from colon  ? COLONOSCOPY    ? HIP SURGERY    ? cartiledge repair, shaving of the bone.  ? ROTATOR CUFF REPAIR    ? SPINAL FUSION    ? UPPER GASTROINTESTINAL ENDOSCOPY    ? ? ?Current Outpatient Medications  ?Medication Sig Dispense Refill  ? ALPRAZolam (XANAX) 0.25 MG tablet TAKE HALF TO ONE TAB 2 X DAY (Patient taking differently: Take 0.125-0.25 mg by mouth 2 (two) times daily as needed for anxiety.) 45 tablet 1  ? aspirin-acetaminophen-caffeine (EXCEDRIN MIGRAINE) 250-250-65 MG tablet Take 2 tablets by mouth every 6 (six) hours as needed for headache.    ? FLUoxetine (PROZAC) 10 MG capsule Take 1 capsule (10 mg total) by mouth daily. 90 capsule 1  ? meloxicam (MOBIC) 15 MG tablet Take 1 tablet (15 mg total) by mouth daily. 30 tablet 3  ? methimazole (TAPAZOLE) 10 MG tablet Take 20 mg by mouth daily.    ? propranolol ER (INDERAL LA) 80 MG 24 hr capsule Take 1 capsule (80 mg total) by mouth daily. 30 capsule 6  ? sucralfate (CARAFATE) 1 g tablet Take 1 g by mouth daily as needed (nausea, abdominal pain).    ? furosemide (LASIX) 20 MG tablet Take 1 tablet (20 mg total) by mouth 2 (two) times daily.  180 tablet 3  ? potassium chloride SA (KLOR-CON M) 20 MEQ tablet Take 1 tablet (20 mEq total) by mouth daily. 30 tablet 6  ? ?No current facility-administered medications for this visit.  ? ? ?Allergies:   Patient has no known allergies.  ? ?Social History:  The patient  reports that she has been smoking cigarettes. She has a 30.00 pack-year smoking history. She has never used smokeless tobacco. She reports current alcohol use of about 3.0 standard drinks per week. She reports that she does not use drugs.  ? ?Family History:   family history includes COPD in her father; Heart attack in her father;  Heart disease in her brother, brother, brother, and father; Hypertension in her brother, brother, brother, father, and mother; Kidney disease in her father; Neuropathy in her mother.  ? ? ?Review of Systems: ?Review of Systems  ?Constitutional: Negative.   ?HENT: Negative.    ?Respiratory: Negative.    ?Cardiovascular: Negative.   ?Gastrointestinal: Negative.   ?Musculoskeletal: Negative.   ?Neurological: Negative.   ?Psychiatric/Behavioral: Negative.    ?All other systems reviewed and are negative. ? ? ?PHYSICAL EXAM: ?VS:  BP (!) 150/70 (BP Location: Left Arm, Patient Position: Sitting, Cuff Size: Normal)   Pulse 77   Ht '5\' 5"'$  (1.651 m)   Wt 172 lb 2 oz (78.1 kg)   SpO2 99%   BMI 28.64 kg/m?  , BMI Body mass index is 28.64 kg/m?. ?GEN: Well nourished, well developed, in no acute distress ?HEENT: normal ?Neck: no JVD, carotid bruits, or masses ?Cardiac: RRR; no murmurs, rubs, or gallops, trivial edema  ?Respiratory:  clear to auscultation bilaterally, normal work of breathing ?GI: soft, nontender, nondistended, + BS ?MS: no deformity or atrophy ?Skin: warm and dry, no rash ?Neuro:  Strength and sensation are intact ?Psych: euthymic mood, full affect ? ?Recent Labs: ?06/19/2021: ALT 47; TSH <0.005 ?07/07/2021: B Natriuretic Peptide 532.2; Hemoglobin 11.5; Platelets 172 ?07/23/2021: BUN 11; Creatinine, Ser 0.40; Potassium 3.7; Sodium 143  ? ? ?Lipid Panel ?Lab Results  ?Component Value Date  ? CHOL 152 06/19/2021  ? HDL 44 06/19/2021  ? [place] 89 06/19/2021  ? TRIG 102 06/19/2021  ? ?  ? ?Wt Readings from Last 3 Encounters:  ?08/12/21 172 lb 2 oz (78.1 kg)  ?07/08/21 182 lb 8 oz (82.8 kg)  ?07/07/21 185 lb (83.9 kg)  ?  ? ? ?ASSESSMENT AND PLAN: ? ?Problem List Items Addressed This Visit   ?None ?Visit Diagnoses   ? ? Congestive heart failure, unspecified HF chronicity, unspecified heart failure type ([place])    -  Primary  ? Relevant Medications  ? furosemide (LASIX) 20 MG tablet  ? propranolol ER (INDERAL LA) 80 MG  24 hr capsule  ? Other Relevant Orders  ? EKG 12-Lead  ? Basic Metabolic Panel (BMET)  ? Aortic atherosclerosis ([place])      ? Relevant Medications  ? furosemide (LASIX) 20 MG tablet  ? propranolol ER (INDERAL LA) 80 MG 24 hr capsule  ? Other Relevant Orders  ? EKG 12-Lead  ? Hyperthyroidism      ? Relevant Medications  ? methimazole (TAPAZOLE) 10 MG tablet  ? propranolol ER (INDERAL LA) 80 MG 24 hr capsule  ? Other Relevant Orders  ? EKG 12-Lead  ? Essential hypertension      ? Relevant Medications  ? furosemide (LASIX) 20 MG tablet  ? propranolol ER (INDERAL LA) 80 MG 24 hr capsule  ? Smoker      ? ?  ? ?  Acute diastolic CHF ?In the setting of high blood pressure, high fluid intake, hyperthyroidism ?Echocardiogram with normal LV function, no valvular heart disease ?Stressed importance of aggressive blood pressure control and management of hyperthyroidism ?Continue Lasix 20 twice daily with potassium ?BMP in 1 month at [place] ? ?Hyperthyroidism ?Followed by endocrine ?Has follow-up to go over her ultrasound showing 9 nodules ?Started on methimazole, ?We will increase propranolol ER to 80 mg daily for hypertension ? ?Essential hypertension ?Recommend she increase propranolol dosing up to ER 80 mg daily ?Suggested monitoring blood pressure at home and call us with some numbers ? ?Smoker ?Smoking cessation recommended ?Reports doing well with Wellbutrin in the past ? ? Total encounter time more than 30 minutes ? Greater than 50% was spent in counseling and coordination of care with the patient ? ? ?Signed, ?Esmond Plants, M.D., Ph.D. ?[facility] [place], [place] ?[phone] ?

## 2021-08-12 ENCOUNTER — Encounter: Payer: Self-pay | Admitting: Cardiovascular Disease

## 2021-08-12 ENCOUNTER — Ambulatory Visit (INDEPENDENT_AMBULATORY_CARE_PROVIDER_SITE_OTHER): Payer: 59 | Admitting: Cardiovascular Disease

## 2021-08-12 VITALS — BP 150/70 | HR 77 | Ht 65.0 in | Wt 172.1 lb

## 2021-08-12 DIAGNOSIS — F17200 Nicotine dependence, unspecified, uncomplicated: Secondary | ICD-10-CM

## 2021-08-12 DIAGNOSIS — I509 Heart failure, unspecified: Secondary | ICD-10-CM | POA: Diagnosis not present

## 2021-08-12 DIAGNOSIS — I10 Essential (primary) hypertension: Secondary | ICD-10-CM | POA: Diagnosis not present

## 2021-08-12 DIAGNOSIS — I700 Atherosclerosis of aorta: Secondary | ICD-10-CM | POA: Diagnosis not present

## 2021-08-12 DIAGNOSIS — E0590 Thyrotoxicosis, unspecified without thyrotoxic crisis or storm: Secondary | ICD-10-CM | POA: Diagnosis not present

## 2021-08-12 MED ORDER — FUROSEMIDE 20 MG PO TABS
20.0000 mg | ORAL_TABLET | Freq: Two times a day (BID) | ORAL | 3 refills | Status: DC
Start: 2021-08-12 — End: 2022-01-08

## 2021-08-12 MED ORDER — PROPRANOLOL HCL ER 80 MG PO CP24: 80.0000 mg | ORAL_CAPSULE | Freq: Every day | ORAL | 6 refills | Status: DC

## 2021-08-12 MED ORDER — POTASSIUM CHLORIDE CRYS ER 20 MEQ PO TBCR: 20.0000 meq | EXTENDED_RELEASE_TABLET | Freq: Every day | ORAL | 6 refills | Status: DC

## 2021-08-12 NOTE — Patient Instructions (Addendum)
Medication Instructions:  ?Please increase the propranolol LA up to 80 mg daily ? ?If you need a refill on your cardiac medications before your next appointment, please call your pharmacy.  ? ?Lab work: ?BMP at [place] 1 month  ? ?Testing/Procedures: ?No new testing needed ? ?Follow-Up: ?At [facility], you and your health needs are our priority.  As part of our continuing mission to provide you with exceptional heart care, we have created designated Provider Care Teams.  These Care Teams include your primary Cardiologist (physician) and Advanced Practice Providers (APPs -  Physician Assistants and Nurse Practitioners) who all work together to provide you with the care you need, when you need it. ? ?You will need a follow up appointment in 6 months ? ?Providers on your designated Care Team:   ?Murray Hodgkins, NP ?Christell Faith, PA-C ?Cadence Kathlen Mody, PA-C ? ?COVID-19 Vaccine Information can be found at: [email or web address].uk For questions related to vaccine distribution or appointments, please email [email or web address] or call [phone].  ? ?

## 2021-08-26 ENCOUNTER — Encounter: Payer: 59 | Admitting: Certified Nurse Midwife

## 2021-09-29 NOTE — Telephone Encounter (Signed)
Endocrinology appt>> 07/29/21 @ 1:30-Toni

## 2021-10-20 ENCOUNTER — Encounter: Payer: Self-pay | Admitting: Physician Assistant

## 2021-10-20 ENCOUNTER — Ambulatory Visit
Admission: RE | Admit: 2021-10-20 | Discharge: 2021-10-20 | Disposition: A | Discharge status: Home / Self Care | Payer: 59 | Source: Ambulatory Visit | Attending: Physician Assistant | Admitting: Physician Assistant

## 2021-10-20 ENCOUNTER — Ambulatory Visit (INDEPENDENT_AMBULATORY_CARE_PROVIDER_SITE_OTHER): Payer: 59 | Admitting: Physician Assistant

## 2021-10-20 VITALS — BP 143/82 | HR 78 | Temp 98.3°F | Resp 16 | Ht 65.0 in | Wt 175.2 lb

## 2021-10-20 DIAGNOSIS — R300 Dysuria: Secondary | ICD-10-CM

## 2021-10-20 DIAGNOSIS — R221 Localized swelling, mass and lump, neck: Secondary | ICD-10-CM | POA: Diagnosis not present

## 2021-10-20 DIAGNOSIS — D4989 Neoplasm of unspecified behavior of other specified sites: Secondary | ICD-10-CM

## 2021-10-20 DIAGNOSIS — I10 Essential (primary) hypertension: Secondary | ICD-10-CM | POA: Diagnosis not present

## 2021-10-20 DIAGNOSIS — E0590 Thyrotoxicosis, unspecified without thyrotoxic crisis or storm: Secondary | ICD-10-CM

## 2021-10-20 DIAGNOSIS — E042 Nontoxic multinodular goiter: Secondary | ICD-10-CM | POA: Diagnosis not present

## 2021-10-20 DIAGNOSIS — F411 Generalized anxiety disorder: Secondary | ICD-10-CM

## 2021-10-20 DIAGNOSIS — E049 Nontoxic goiter, unspecified: Secondary | ICD-10-CM

## 2021-10-20 DIAGNOSIS — Z0001 Encounter for general adult medical examination with abnormal findings: Secondary | ICD-10-CM | POA: Diagnosis not present

## 2021-10-20 DIAGNOSIS — Z1231 Encounter for screening mammogram for malignant neoplasm of breast: Secondary | ICD-10-CM

## 2021-10-20 MED ORDER — IOHEXOL 300 MG/ML  SOLN
75.0000 mL | Freq: Once | INTRAMUSCULAR | Status: AC | PRN
  Administered 2021-10-20: 100 mL via INTRAVENOUS

## 2021-10-20 NOTE — Progress Notes (Cosign Needed)
[facility] [place], [place] [address]  Internal MEDICINE  Office Visit Note  Patient Name: Deanna Howard  [id number]  [id number]  Date of Service: 10/20/2021  Chief Complaint  Patient presents with   Neck Pain    Swollen neck, uncomfortable to move in certain positions - swollen towards front of neck/throat   Annual Exam   Depression   Hyperlipidemia     HPI Pt is here for routine health maintenance examination -Swelling in neck for the past week. It is a little better now than it was. Bending neck forward to reach up to do her hair will have some difficulty swallowing and cough triggered, but otherwise if upright no problems with this. Reports since thyroid enlargement started was having some of the swallowing difficulty and isn't new. She denies SOB at this time.  -Does have some chronic sinus symptoms -She is now followed by endo for her thyroid and was found to have 9 thyroid nodules, did biopsy on 2 and were negative for cancer. Did have labs drawn and were improving so dose of methimazole was decreased. She has follow up with endo over Video visit 8/30, with labs prior -Cardiology has her on lasix and propranolol. Was told she could wean off of this, but hasnt felt ready to do so yet. Has follow up in Nov but may move sooner -Between 10am-2pm gets very tired and hard to stay awake. Has been worsening since methimazole lowered. -She states that endo told her they do not address thymomas and was advised to follow up with PCP. She will need further imaging done and likely thoracic surgery referral to discuss possible resection pending imaging findings. Given increased swelling in neck today as well will go ahead and order STAT CT of neck and chest and follow up accordingly. -Will also go ahead and refer to neurology to evaluate possible early MG as she may have thymoma and some weakness and fatigue during the day now as well. -BP borderline in office. Has not  taken meds yet today. -Due for mammogram, UTD on pap and colonoscopy  Current Medication: Outpatient Encounter Medications as of 10/20/2021  Medication Sig   ALPRAZolam (XANAX) 0.25 MG tablet TAKE HALF TO ONE TAB 2 X DAY (Patient taking differently: Take 0.125-0.25 mg by mouth 2 (two) times daily as needed for anxiety.)   aspirin-acetaminophen-caffeine (EXCEDRIN MIGRAINE) 250-250-65 MG tablet Take 2 tablets by mouth every 6 (six) hours as needed for headache.   FLUoxetine (PROZAC) 10 MG capsule Take 1 capsule (10 mg total) by mouth daily.   furosemide (LASIX) 20 MG tablet Take 1 tablet (20 mg total) by mouth 2 (two) times daily.   meloxicam (MOBIC) 15 MG tablet Take 1 tablet (15 mg total) by mouth daily.   methimazole (TAPAZOLE) 10 MG tablet Take 10 mg by mouth daily.   potassium chloride SA (KLOR-CON M) 20 MEQ tablet Take 1 tablet (20 mEq total) by mouth daily.   propranolol ER (INDERAL LA) 80 MG 24 hr capsule Take 1 capsule (80 mg total) by mouth daily.   sucralfate (CARAFATE) 1 g tablet Take 1 g by mouth daily as needed (nausea, abdominal pain).   No facility-administered encounter medications on file as of 10/20/2021.    Surgical History: Past Surgical History:  Procedure Laterality Date   APPENDECTOMY     CHOLECYSTECTOMY     COLON SURGERY     removed tumor from colon   COLONOSCOPY     HIP SURGERY  cartiledge repair, shaving of the bone.   ROTATOR CUFF REPAIR     SPINAL FUSION     UPPER GASTROINTESTINAL ENDOSCOPY      Medical History: Past Medical History:  Diagnosis Date   Depression    Femoroacetabular impingement of both hips    History of duodenal ulcer    Hyperlipidemia    Osteoarthritis    Thyroid disease     Family History: Family History  Problem Relation Age of Onset   Neuropathy Mother    Hypertension Mother    Heart disease Father    Kidney disease Father    Hypertension Father    Heart attack Father    COPD Father    Hypertension Brother     Heart disease Brother    Heart disease Brother    Hypertension Brother    Heart disease Brother    Hypertension Brother       Review of Systems  Constitutional:  Positive for fatigue. Negative for chills and unexpected weight change.  HENT:  Positive for rhinorrhea. Negative for congestion, postnasal drip, sneezing and sore throat.        Neck swelling  Eyes:  Negative for redness.  Respiratory:  Negative for cough, chest tightness, shortness of breath and stridor.   Cardiovascular:  Negative for chest pain and palpitations.  Gastrointestinal:  Negative for abdominal pain, constipation, diarrhea, nausea and vomiting.  Genitourinary:  Negative for dysuria and frequency.  Musculoskeletal:  Positive for arthralgias, back pain and neck pain. Negative for joint swelling.  Skin:  Negative for rash.  Neurological: Negative.  Negative for tremors and numbness.  Hematological:  Negative for adenopathy. Does not bruise/bleed easily.  Psychiatric/Behavioral:  Negative for behavioral problems (Depression), sleep disturbance and suicidal ideas. The patient is not nervous/anxious.      Vital Signs: BP (!) 143/82   Pulse 78   Temp 98.3 F (36.8 C)   Resp 16   Ht 5' 5" (1.651 m)   Wt 175 lb 3.2 oz (79.5 kg)   SpO2 97%   BMI 29.15 kg/m    Physical Exam Vitals and nursing note reviewed.  Constitutional:      Appearance: Normal appearance.  HENT:     Head: Normocephalic and atraumatic.     Nose: Nose normal.     Mouth/Throat:     Mouth: Mucous membranes are moist.     Pharynx: No posterior oropharyngeal erythema.  Eyes:     Extraocular Movements: Extraocular movements intact.     Pupils: Pupils are equal, round, and reactive to light.  Neck:     Comments: Bilateral anterolateral neck swelling Cardiovascular:     Rate and Rhythm: Normal rate and regular rhythm.     Pulses: Normal pulses.     Heart sounds: Normal heart sounds.  Pulmonary:     Effort: Pulmonary effort is normal.      Breath sounds: Normal breath sounds.  Abdominal:     General: Abdomen is flat. Bowel sounds are normal.     Palpations: Abdomen is soft.  Musculoskeletal:        General: Normal range of motion.     Cervical back: No tenderness.  Skin:    General: Skin is warm and dry.  Neurological:     General: No focal deficit present.     Mental Status: She is alert.  Psychiatric:        Mood and Affect: Mood normal.        Behavior: Behavior normal.  LABS: Recent Results (from the past 2160 hour(s))  Basic Metabolic Panel (BMET)     Status: Abnormal   Collection Time: 07/23/21  7:04 AM  Result Value Ref Range   Glucose 107 (H) 70 - 99 mg/dL   BUN 11 6 - 24 mg/dL   Creatinine, Ser 0.40 (L) 0.57 - 1.00 mg/dL   eGFR 115 >59 mL/min/1.73   BUN/Creatinine Ratio 28 (H) 9 - 23   Sodium 143 134 - 144 mmol/L   Potassium 3.7 3.5 - 5.2 mmol/L   Chloride 104 96 - 106 mmol/L   CO2 24 20 - 29 mmol/L   Calcium 10.1 8.7 - 10.2 mg/dL  ECHOCARDIOGRAM COMPLETE     Status: None   Collection Time: 07/23/21  8:40 AM  Result Value Ref Range   AR max vel 2.26 cm2   AV Peak grad 16.0 mmHg   Ao pk vel 2.00 m/s   S' Lateral 2.70 cm   Area-P 1/2 4.26 cm2   AV Area VTI 2.36 cm2   AV Mean grad 8.0 mmHg   Single Plane A4C EF 67.3 %   Single Plane A2C EF 70.6 %   Calc EF 70.2 %   AV Area mean vel 2.31 cm2   AV Vena cont 0.20 cm        Assessment/Plan: 1. Encounter for general adult medical examination with abnormal findings CPE performed, due for mammogram, UTD on pap and colonoscopy  2. Benign hypertension Mildly elevated in office and will continue current medications and monitoring  3. Thymoma Will need further evaluation with CT neck and chest. Will also go ahead and place neurology referral due to likely thymoma and concern for recent fatigue and weakness as possible early signs of MG. - CT Soft Tissue Neck W Contrast; Future - CT Chest Wo Contrast; Future - Ambulatory referral to  Neurology  4. Neck swelling Neck swelling present on exam today and for the past week per patient, will order CT of neck and chest for further evaluation. Advised to call or go to ED if any acute worsening, especially if any difficulty breathing. - CT Soft Tissue Neck W Contrast; Future - CT Chest Wo Contrast; Future  5. Multiple thyroid nodules Followed by endocrinology  6. Hyperthyroidism Followed by endocrinology  7. GAD (generalized anxiety disorder) May continue prozac as before  8. Visit for screening mammogram - MM 3D SCREEN BREAST BILATERAL; Future  9. Dysuria - UA/M w/rflx Culture, Routine   General Counseling: Arden verbalizes understanding of the findings of todays visit and agrees with plan of treatment. I have discussed any further diagnostic evaluation that may be needed or ordered today. We also reviewed her medications today. she has been encouraged to call the office with any questions or concerns that should arise related to todays visit.    Counseling:    Orders Placed This Encounter  Procedures   CT Soft Tissue Neck W Contrast   CT Chest Wo Contrast   MM 3D SCREEN BREAST BILATERAL   UA/M w/rflx Culture, Routine   Ambulatory referral to Neurology    No orders of the defined types were placed in this encounter.   This patient was seen by Drema Dallas, PA-C in collaboration with Dr. Clayborn Bigness as a part of collaborative care agreement.  Total time spent:40 Minutes  Time spent includes review of chart, medications, test results, and follow up plan with the patient.     Lavera Guise, MD  Internal Medicine

## 2021-10-21 ENCOUNTER — Telehealth: Payer: Self-pay

## 2021-10-21 LAB — UA/M W/RFLX CULTURE, ROUTINE
Bilirubin, UA: NEGATIVE
Glucose, UA: NEGATIVE
Leukocytes,UA: NEGATIVE
Nitrite, UA: NEGATIVE
RBC, UA: NEGATIVE
Specific Gravity, UA: >=1.03 — AB (ref 1.005–1.030)
Urobilinogen, Ur: 0.2 mg/dL (ref 0.2–1.0)
pH, UA: 5.5 (ref 5.0–7.5)

## 2021-10-21 LAB — MICROSCOPIC EXAMINATION
Casts: NONE SEEN /lpf
Epithelial Cells (non renal): >10 /hpf — AB (ref 0–10)

## 2021-10-21 NOTE — Telephone Encounter (Signed)
Urgent Vein/Vascular referral faxed to [facility]

## 2021-10-21 NOTE — Telephone Encounter (Signed)
Urgent Neurology referral faxed to [facility]

## 2021-10-21 NOTE — Telephone Encounter (Signed)
Urgent ENT referral faxed to [facility]

## 2021-10-21 NOTE — Addendum Note (Signed)
Addended by: Drema Dallas on: 10/21/2021 10:21 AM   Modules accepted: Orders

## 2021-10-30 ENCOUNTER — Telehealth: Payer: Self-pay

## 2021-10-30 NOTE — Telephone Encounter (Signed)
Called pt back and advised that the referrals would be followed up on as they are to be for urgent appt's, Spoke to [place] that handles the referrals and she is getting in touch with neuro and ENT.  Advised pt she can try to call both places and see if they will move her appts sooner.  I also advised pt that if her symptoms become any worse or she has any SOB she needs to go to the ER.

## 2021-10-31 ENCOUNTER — Telehealth: Payer: Self-pay

## 2021-10-31 NOTE — Telephone Encounter (Signed)
On 3-way call with patient to [facility] Neurology to see if her appointment could be made sooner than 01/01/22 since referral was urgent. Receptionist s/w her supervisor, the will have meeting to determine and call patient if her appointment can be moved up. Patient will call me back to let me know-Toni

## 2021-11-11 ENCOUNTER — Telehealth: Payer: Self-pay

## 2021-11-11 NOTE — Telephone Encounter (Signed)
Spoke with patient. She stated she never heard back from [facility] to get her appointment moved up after our 3-way call. Dfk will call her-Toni

## 2021-12-18 ENCOUNTER — Ambulatory Visit
Admission: RE | Admit: 2021-12-18 | Discharge: 2021-12-18 | Disposition: A | Discharge status: Home / Self Care | Payer: 59 | Source: Ambulatory Visit | Attending: Physician Assistant | Admitting: Physician Assistant

## 2021-12-18 DIAGNOSIS — Z1231 Encounter for screening mammogram for malignant neoplasm of breast: Secondary | ICD-10-CM | POA: Diagnosis present

## 2021-12-31 ENCOUNTER — Inpatient Hospital Stay
Admission: RE | Admit: 2021-12-31 | Discharge: 2021-12-31 | Disposition: A | Discharge status: Home / Self Care | Payer: Self-pay | Source: Ambulatory Visit | Attending: *Deleted | Admitting: *Deleted

## 2021-12-31 ENCOUNTER — Other Ambulatory Visit: Payer: Self-pay | Admitting: Physician Assistant

## 2021-12-31 ENCOUNTER — Other Ambulatory Visit: Payer: Self-pay | Admitting: *Deleted

## 2021-12-31 DIAGNOSIS — N6489 Other specified disorders of breast: Secondary | ICD-10-CM

## 2021-12-31 DIAGNOSIS — Z1231 Encounter for screening mammogram for malignant neoplasm of breast: Secondary | ICD-10-CM

## 2021-12-31 DIAGNOSIS — R928 Other abnormal and inconclusive findings on diagnostic imaging of breast: Secondary | ICD-10-CM

## 2022-01-08 ENCOUNTER — Ambulatory Visit (INDEPENDENT_AMBULATORY_CARE_PROVIDER_SITE_OTHER): Payer: 59 | Admitting: Physician Assistant

## 2022-01-08 ENCOUNTER — Other Ambulatory Visit: Payer: Self-pay | Admitting: Physician Assistant

## 2022-01-08 ENCOUNTER — Encounter: Payer: Self-pay | Admitting: Physician Assistant

## 2022-01-08 VITALS — BP 140/80 | HR 106 | Temp 97.8°F | Resp 16 | Ht 65.0 in | Wt 166.0 lb

## 2022-01-08 DIAGNOSIS — R000 Tachycardia, unspecified: Secondary | ICD-10-CM | POA: Diagnosis not present

## 2022-01-08 DIAGNOSIS — R5383 Other fatigue: Secondary | ICD-10-CM

## 2022-01-08 DIAGNOSIS — M130 Polyarthritis, unspecified: Secondary | ICD-10-CM

## 2022-01-08 DIAGNOSIS — I10 Essential (primary) hypertension: Secondary | ICD-10-CM | POA: Diagnosis not present

## 2022-01-08 DIAGNOSIS — E0500 Thyrotoxicosis with diffuse goiter without thyrotoxic crisis or storm: Secondary | ICD-10-CM

## 2022-01-08 MED ORDER — PROPRANOLOL HCL 10 MG PO TABS: 10.0000 mg | ORAL_TABLET | Freq: Three times a day (TID) | ORAL | 1 refills | Status: DC

## 2022-01-08 NOTE — Telephone Encounter (Signed)
Fill as previous sent.

## 2022-01-08 NOTE — Progress Notes (Signed)
[facility] [place], [place] [address]  Internal MEDICINE  Office Visit Note  Patient Name: Deanna Howard  [id number]  [id number]  Date of Service: 01/14/2022  Chief Complaint  Patient presents with   Fatigue     HPI Pt is here for a sick visit. -She went to ENT due to thymoma and was told she should move forward with neurology evaluation and follow up as needed. She went to neurology for evaluation of possible MG after finding of thymoma, and states they told her she did not have MG based on exam and recommended a sleep study and no further follow up with their office. -No energy and fatigue all day.  Some days are worse than others -She did have sleep study in the past 10 years that did not show OSA despite hx of snoring at the time. Does snore at night, doesn't startle awake and no one has told her she stops breathing in her sleep. She does not think there have been any changes to her sleep since study done and is not interested in retesting at this time. -Goes back to endocrinology next week for repeat labs following Graves disease -No swelling in extremities or SOB and has stopped lasix and was also told she could stop propranolol as well, but sometimes HR still a little elevated. Discussed adding back lower dose propranolol prn -Joint pain all over along with the fatigue -has taken medrol dose pack/prednisone a long time ago that seemed to help when she would gets aches. Discussed ordering labs to check for autoimmune and inflammatory markers.  -Goes to sleep around 10pm and takes 15-42mns to fall asleep and wakes up 5:30-6am, wakes up a few times at night. Sometimes due to pain and occasionally to use restroom. Takes 10-170ms to go back to sleep. -cant tolerate cymbalta due to worsening fatigue, taking meloxicam.   EPWORTH SLEEPINESS SCALE:  Scale:  (0)= no chance of dozing; (1)= slight chance of dozing; (2)= moderate chance of dozing; (3)= high  chance of dozing  Chance  Situtation    Sitting and reading: 2    Watching TV: 2    Sitting Inactive in public: 0    As a passenger in car: 1      Lying down to rest: 1    Sitting and talking: 0    Sitting quielty after lunch: 0    In a car, stopped in traffic: 0   TOTAL SCORE:   6 out of 24   Current Medication:  Outpatient Encounter Medications as of 01/08/2022  Medication Sig   ALPRAZolam (XANAX) 0.25 MG tablet TAKE HALF TO ONE TAB 2 X DAY (Patient taking differently: Take 0.125-0.25 mg by mouth 2 (two) times daily as needed for anxiety.)   aspirin-acetaminophen-caffeine (EXCEDRIN MIGRAINE) 250-250-65 MG tablet Take 2 tablets by mouth every 6 (six) hours as needed for headache.   FLUoxetine (PROZAC) 10 MG capsule Take 1 capsule (10 mg total) by mouth daily.   meloxicam (MOBIC) 15 MG tablet Take 1 tablet (15 mg total) by mouth daily.   methimazole (TAPAZOLE) 10 MG tablet Take 10 mg by mouth daily.   potassium chloride SA (KLOR-CON M) 20 MEQ tablet Take 1 tablet (20 mEq total) by mouth daily.   propranolol (INDERAL) 10 MG tablet Take 1 tablet (10 mg total) by mouth 3 (three) times daily.   sucralfate (CARAFATE) 1 g tablet Take 1 g by mouth daily as needed (nausea, abdominal pain).   [DISCONTINUED]  propranolol ER (INDERAL LA) 80 MG 24 hr capsule Take 1 capsule (80 mg total) by mouth daily.   losartan (COZAAR) 50 MG tablet Take 50 mg by mouth daily.   [DISCONTINUED] furosemide (LASIX) 20 MG tablet Take 1 tablet (20 mg total) by mouth 2 (two) times daily. (Patient not taking: Reported on 01/08/2022)   No facility-administered encounter medications on file as of 01/08/2022.      Medical History: Past Medical History:  Diagnosis Date   Depression    Femoroacetabular impingement of both hips    History of duodenal ulcer    Hyperlipidemia    Osteoarthritis    Thyroid disease      Vital Signs: BP (!) 140/80   Pulse (!) 106   Temp 97.8 F (36.6 C)   Resp 16   Ht  5' 5"  (1.651 m)   Wt 166 lb (75.3 kg)   SpO2 98%   BMI 27.62 kg/m    Review of Systems  Constitutional:  Positive for fatigue. Negative for chills and unexpected weight change.  HENT:  Negative for congestion, postnasal drip, rhinorrhea, sneezing and sore throat.   Eyes:  Negative for redness.  Respiratory:  Negative for cough, chest tightness and shortness of breath.   Cardiovascular:  Negative for chest pain and palpitations.  Gastrointestinal:  Negative for abdominal pain, constipation, diarrhea, nausea and vomiting.  Genitourinary:  Negative for dysuria and frequency.  Musculoskeletal:  Positive for arthralgias. Negative for back pain, joint swelling and neck pain.  Skin:  Negative for rash.  Neurological: Negative.  Negative for tremors and numbness.  Hematological:  Negative for adenopathy. Does not bruise/bleed easily.  Psychiatric/Behavioral:  Negative for behavioral problems (Depression), sleep disturbance and suicidal ideas. The patient is not nervous/anxious.     Physical Exam Vitals and nursing note reviewed.  Constitutional:      General: She is not in acute distress.    Appearance: Normal appearance. She is well-developed. She is not diaphoretic.  HENT:     Head: Normocephalic and atraumatic.     Mouth/Throat:     Pharynx: No oropharyngeal exudate.  Eyes:     Pupils: Pupils are equal, round, and reactive to light.  Neck:     Thyroid: No thyromegaly.     Vascular: No JVD.     Trachea: No tracheal deviation.  Cardiovascular:     Rate and Rhythm: Regular rhythm. Tachycardia present.     Heart sounds: Normal heart sounds. No murmur heard.    No friction rub. No gallop.  Pulmonary:     Effort: Pulmonary effort is normal. No respiratory distress.     Breath sounds: No wheezing or rales.  Chest:     Chest wall: No tenderness.  Abdominal:     General: Bowel sounds are normal.     Palpations: Abdomen is soft.  Musculoskeletal:        General: Normal range of  motion.     Cervical back: Normal range of motion and neck supple.  Lymphadenopathy:     Cervical: No cervical adenopathy.  Skin:    General: Skin is warm and dry.  Neurological:     Mental Status: She is alert and oriented to person, place, and time.     Cranial Nerves: No cranial nerve deficit.  Psychiatric:        Behavior: Behavior normal.        Thought Content: Thought content normal.        Judgment: Judgment normal.  Assessment/Plan: 1. Benign hypertension Will continue losartan and add 87m propranolol as needed for elevated HR or BP - propranolol (INDERAL) 10 MG tablet; Take 1 tablet (10 mg total) by mouth 3 (three) times daily.  Dispense: 90 tablet; Refill: 1  2. Tachycardia Will start lower dose propranolol as needed - propranolol (INDERAL) 10 MG tablet; Take 1 tablet (10 mg total) by mouth 3 (three) times daily.  Dispense: 90 tablet; Refill: 1  3. Polyarthropathy - Rheumatoid Factor - ANA w/Reflex if Positive - Sed Rate (ESR) - CBC w/Diff/Platelet - Comprehensive metabolic panel - CYCLIC CITRUL PEPTIDE ANTIBODY, IGG/IGA  4. Other fatigue - Rheumatoid Factor - ANA w/Reflex if Positive - Sed Rate (ESR) - CBC w/Diff/Platelet - Comprehensive metabolic panel - CYCLIC CITRUL PEPTIDE ANTIBODY, IGG/IGA  5. Graves' disease Followed by endocrinology   General Counseling: TRudean Curtunderstanding of the findings of todays visit and agrees with plan of treatment. I have discussed any further diagnostic evaluation that may be needed or ordered today. We also reviewed her medications today. she has been encouraged to call the office with any questions or concerns that should arise related to todays visit.    Counseling:    Orders Placed This Encounter  Procedures   Rheumatoid Factor   ANA w/Reflex if Positive   Sed Rate (ESR)   CBC w/Diff/Platelet   Comprehensive metabolic panel   CYCLIC CITRUL PEPTIDE ANTIBODY, IGG/IGA   CYCLIC CITRUL PEPTIDE  ANTIBODY, IGG/IGA   Specimen status report    Meds ordered this encounter  Medications   propranolol (INDERAL) 10 MG tablet    Sig: Take 1 tablet (10 mg total) by mouth 3 (three) times daily.    Dispense:  90 tablet    Refill:  1    Time spent:30 Minutes

## 2022-01-09 LAB — COMPREHENSIVE METABOLIC PANEL
ALT: 25 IU/L (ref 0–32)
AST: 19 IU/L (ref 0–40)
Albumin/Globulin Ratio: 1.6 (ref 1.2–2.2)
Albumin: 4.2 g/dL (ref 3.8–4.9)
Alkaline Phosphatase: 157 IU/L — ABNORMAL HIGH (ref 44–121)
BUN/Creatinine Ratio: 32 — ABNORMAL HIGH (ref 9–23)
BUN: 15 mg/dL (ref 6–24)
Bilirubin Total: 0.3 mg/dL (ref 0.0–1.2)
CO2: 25 mmol/L (ref 20–29)
Calcium: 9.8 mg/dL (ref 8.7–10.2)
Chloride: 102 mmol/L (ref 96–106)
Creatinine, Ser: 0.47 mg/dL — ABNORMAL LOW (ref 0.57–1.00)
Globulin, Total: 2.7 g/dL (ref 1.5–4.5)
Glucose: 97 mg/dL (ref 70–99)
Potassium: 4.1 mmol/L (ref 3.5–5.2)
Sodium: 141 mmol/L (ref 134–144)
Total Protein: 6.9 g/dL (ref 6.0–8.5)
eGFR: 110 mL/min/{1.73_m2} (ref >59)

## 2022-01-09 LAB — CBC WITH DIFFERENTIAL/PLATELET
Basophils Absolute: 0 10*3/uL (ref 0.0–0.2)
Basos: 1 %
EOS (ABSOLUTE): 0.1 10*3/uL (ref 0.0–0.4)
Eos: 1 %
Hematocrit: 40.7 % (ref 34.0–46.6)
Hemoglobin: 13.4 g/dL (ref 11.1–15.9)
Immature Grans (Abs): 0 10*3/uL (ref 0.0–0.1)
Immature Granulocytes: 0 %
Lymphocytes Absolute: 2.1 10*3/uL (ref 0.7–3.1)
Lymphs: 36 %
MCH: 28.7 pg (ref 26.6–33.0)
MCHC: 32.9 g/dL (ref 31.5–35.7)
MCV: 87 fL (ref 79–97)
Monocytes Absolute: 0.8 10*3/uL (ref 0.1–0.9)
Monocytes: 13 %
Neutrophils Absolute: 3 10*3/uL (ref 1.4–7.0)
Neutrophils: 49 %
Platelets: 217 10*3/uL (ref 150–450)
RBC: 4.67 x10E6/uL (ref 3.77–5.28)
RDW: 12.4 % (ref 11.7–15.4)
WBC: 5.9 10*3/uL (ref 3.4–10.8)

## 2022-01-09 LAB — RHEUMATOID FACTOR: Rheumatoid fact SerPl-aCnc: 10.1 IU/mL (ref <14.0)

## 2022-01-09 LAB — ANA W/REFLEX IF POSITIVE: Anti Nuclear Antibody (ANA): NEGATIVE

## 2022-01-09 LAB — SEDIMENTATION RATE: Sed Rate: 25 mm/hr (ref 0–40)

## 2022-01-14 LAB — SPECIMEN STATUS REPORT

## 2022-01-15 ENCOUNTER — Ambulatory Visit
Admission: RE | Admit: 2022-01-15 | Discharge: 2022-01-15 | Disposition: A | Discharge status: Home / Self Care | Payer: 59 | Source: Ambulatory Visit | Attending: Physician Assistant | Admitting: Physician Assistant

## 2022-01-15 DIAGNOSIS — R928 Other abnormal and inconclusive findings on diagnostic imaging of breast: Secondary | ICD-10-CM | POA: Diagnosis present

## 2022-01-15 DIAGNOSIS — N6489 Other specified disorders of breast: Secondary | ICD-10-CM | POA: Insufficient documentation

## 2022-01-15 LAB — SPECIMEN STATUS REPORT

## 2022-01-15 LAB — CYCLIC CITRUL PEPTIDE ANTIBODY, IGG/IGA: Cyclic Citrullin Peptide Ab: <1 units (ref 0–19)

## 2022-01-16 ENCOUNTER — Telehealth: Payer: Self-pay

## 2022-01-16 NOTE — Telephone Encounter (Signed)
Spoke with patient regarding normal lab results.

## 2022-02-14 NOTE — Progress Notes (Unsigned)
Cardiology Office Note  Date:  02/16/2022   ID:  Deanna Howard, DOB 02-Jan-1966, MRN [id number]  PCP:  Mylinda Latina, PA-C   Chief Complaint  Patient presents with   6 month follow up     "Doing well." Medications reviewed by the patient verbally.     HPI:  Deanna Howard is a 56 y.o. female with history of  hyperlipidemia,  thyroid disease, nodule 2.5 cm cigarette smoking , 1 ppd Presenting to the emergency room 2023 with shortness of breath.  Who presents for new patient today for her shortness of breath, tachypalpitations, hypothyroidism  Last seen in clinic by myself May  2023 On today's visit she reports that she is Off lasix/potassium, felt better, denies swelling Propranolol ER dose changed from 80 down to 60 She reports that endocrine tried to hold the medication but she did not feel well, had tachypalpitations so 60 mg was restarted  Weight down 10 pounds Less of an appetite  Lab work reviewed TSH <0.01  Still smoking 1 pack/day  EKG personally reviewed by myself on todays visit Normal sinus rhythm rate 93 bpm no significant ST-T wave changes  Other past medical history   leg swelling, shortness of breath starting February 2023 emergency room April 2023, blood pressure elevated, started on Lasix twice a day Felt to have pulmonary edema, high fluid and salt intake, started on Lasix twice a day with potassium On that visit was changed from metoprolol to tartrate to propranolol ER 60 daily for hyperthyroid  Echocardiogram ordered at that time performed July 23, 2021 Normal LV function, no significant valvular heart disease  Lab work reviewed TSH less than 0.01 in march 23 and may 23  free T4 -4.92 Creatinine 0.4 BUN 11 end of April  Seen by endocrine Jul 29, 2021 started on methimazole 20 mg daily  BP at home 696 systolic or higher  U/s thyroid: 9 nodules  CT scan images 2.5 cm thyroid nodule noted Minimal aortic atherosclerosis Small pleural  effusions, pulmonary edema   PMH:   has a past medical history of Depression, Femoroacetabular impingement of both hips, History of duodenal ulcer, Hyperlipidemia, Osteoarthritis, and Thyroid disease.  PSH:    Past Surgical History:  Procedure Laterality Date   APPENDECTOMY     CHOLECYSTECTOMY     COLON SURGERY     removed tumor from colon   COLONOSCOPY     HIP SURGERY     cartiledge repair, shaving of the bone.   ROTATOR CUFF REPAIR     SPINAL FUSION     UPPER GASTROINTESTINAL ENDOSCOPY      Current Outpatient Medications  Medication Sig Dispense Refill   ALPRAZolam (XANAX) 0.25 MG tablet TAKE HALF TO ONE TAB 2 X DAY (Patient taking differently: Take 0.125-0.25 mg by mouth 2 (two) times daily as needed for anxiety.) 45 tablet 1   aspirin-acetaminophen-caffeine (EXCEDRIN MIGRAINE) 250-250-65 MG tablet Take 2 tablets by mouth every 6 (six) hours as needed for headache.     FLUoxetine (PROZAC) 10 MG capsule Take 1 capsule (10 mg total) by mouth daily. 90 capsule 1   meloxicam (MOBIC) 15 MG tablet Take 1 tablet (15 mg total) by mouth daily. 30 tablet 3   methimazole (TAPAZOLE) 10 MG tablet Take 10 mg by mouth daily.     propranolol ER (INDERAL LA) 60 MG 24 hr capsule Take 60 mg by mouth daily.     sucralfate (CARAFATE) 1 g tablet Take 1 g by mouth daily as  needed (nausea, abdominal pain).     No current facility-administered medications for this visit.    Allergies:   Patient has no known allergies.   Social History:  The patient  reports that she has been smoking cigarettes. She has a 30.00 pack-year smoking history. She has never used smokeless tobacco. She reports current alcohol use of about 3.0 standard drinks of alcohol per week. She reports that she does not use drugs.   Family History:   family history includes COPD in her father; Heart attack in her father; Heart disease in her brother, brother, brother, and father; Hypertension in her brother, brother, brother, father, and  mother; Kidney disease in her father; Neuropathy in her mother.    Review of Systems: Review of Systems  Constitutional: Negative.   HENT: Negative.    Respiratory: Negative.    Cardiovascular: Negative.   Gastrointestinal: Negative.   Musculoskeletal: Negative.   Neurological: Negative.   Psychiatric/Behavioral: Negative.    All other systems reviewed and are negative.   PHYSICAL EXAM: VS:  BP (!) 140/62 (BP Location: Left Arm, Patient Position: Sitting, Cuff Size: Normal)   Pulse 93   Ht '5\' 4"'$  (1.626 m)   Wt 162 lb 4 oz (73.6 kg)   SpO2 98%   BMI 27.85 kg/m  , BMI Body mass index is 27.85 kg/m. GEN: Well nourished, well developed, in no acute distress HEENT: normal Neck: no JVD, + carotid bruits b/l , no masses Cardiac: RRR; no murmurs, rubs, or gallops, trivial edema  Respiratory:  clear to auscultation bilaterally, normal work of breathing GI: soft, nontender, nondistended, + BS MS: no deformity or atrophy Skin: warm and dry, no rash Neuro:  Strength and sensation are intact Psych: euthymic mood, full affect  Recent Labs: 06/19/2021: TSH <0.005 07/07/2021: B Natriuretic Peptide 532.2 01/08/2022: ALT 25; BUN 15; Creatinine, Ser 0.47; Hemoglobin 13.4; Platelets 217; Potassium 4.1; Sodium 141    Lipid Panel Lab Results  Component Value Date   CHOL 152 06/19/2021   HDL 44 06/19/2021   LDLCALC 89 06/19/2021   TRIG 102 06/19/2021      Wt Readings from Last 3 Encounters:  02/16/22 162 lb 4 oz (73.6 kg)  01/08/22 166 lb (75.3 kg)  10/20/21 175 lb 3.2 oz (79.5 kg)      ASSESSMENT AND PLAN:  Problem List Items Addressed This Visit   None Visit Diagnoses     Chronic diastolic CHF (congestive heart failure) (HCC)    -  Primary   Relevant Medications   propranolol ER (INDERAL LA) 60 MG 24 hr capsule   Aortic atherosclerosis (HCC)       Relevant Medications   propranolol ER (INDERAL LA) 60 MG 24 hr capsule   Smoker       Essential hypertension        Relevant Medications   propranolol ER (INDERAL LA) 60 MG 24 hr capsule     Acute diastolic CHF Previously seen in the emergency room March 2023 in the setting of high blood pressure, high fluid intake, hyperthyroidism Echocardiogram with normal LV function, no valvular heart disease She reports that she stopped her Lasix and potassium as she was feeling better with no leg swelling, no shortness of breath We have recommended she continue Lasix potassium as needed for any abdominal swelling, leg swelling, shortness of breath symptoms Would monitor blood pressure at home  Hyperthyroidism Followed by endocrine on methimazole, and propranolol ER 60 daily She is bothered by fullness in her  neck  Essential hypertension Dosing of her propranolol decreased by endocrine down to 60 Blood pressure borderline elevated We have provided propranolol 20 to take as needed for breakthrough tachypalpitations  Smoker Smoking cessation recommended Still smoking 1 pack/day, previously quit with Wellbutrin  Carotid bruit Unclear etiology in the setting of thyroid disease Carotid ultrasound ordered given smoking history   Total encounter time more than 30 minutes  Greater than 50% was spent in counseling and coordination of care with the patient   Signed, Esmond Plants, M.D., Ph.D. [place], [place]

## 2022-02-16 ENCOUNTER — Ambulatory Visit: Payer: 59 | Attending: Cardiovascular Disease | Admitting: Cardiovascular Disease

## 2022-02-16 ENCOUNTER — Encounter: Payer: Self-pay | Admitting: Cardiovascular Disease

## 2022-02-16 VITALS — BP 140/62 | HR 93 | Ht 64.0 in | Wt 162.2 lb

## 2022-02-16 DIAGNOSIS — I10 Essential (primary) hypertension: Secondary | ICD-10-CM | POA: Diagnosis not present

## 2022-02-16 DIAGNOSIS — I5032 Chronic diastolic (congestive) heart failure: Secondary | ICD-10-CM | POA: Diagnosis not present

## 2022-02-16 DIAGNOSIS — F17200 Nicotine dependence, unspecified, uncomplicated: Secondary | ICD-10-CM | POA: Diagnosis not present

## 2022-02-16 DIAGNOSIS — I700 Atherosclerosis of aorta: Secondary | ICD-10-CM | POA: Diagnosis not present

## 2022-02-16 DIAGNOSIS — R0989 Other specified symptoms and signs involving the circulatory and respiratory systems: Secondary | ICD-10-CM

## 2022-02-16 MED ORDER — POTASSIUM CHLORIDE CRYS ER 20 MEQ PO TBCR: 20.0000 meq | EXTENDED_RELEASE_TABLET | Freq: Every day | ORAL | 3 refills | Status: AC | PRN

## 2022-02-16 MED ORDER — PROPRANOLOL HCL 20 MG PO TABS: 20.0000 mg | ORAL_TABLET | Freq: Three times a day (TID) | ORAL | 3 refills | Status: DC | PRN

## 2022-02-16 MED ORDER — FUROSEMIDE 20 MG PO TABS: 20.0000 mg | ORAL_TABLET | Freq: Every day | ORAL | 3 refills | Status: DC | PRN

## 2022-02-16 NOTE — Patient Instructions (Addendum)
Medication Instructions:  - Propranolol 20 mg three times a day as needed for tachycardia  - Continue lasix with potassium as needed for leg swelling  If you need a refill on your cardiac medications before your next appointment, please call your pharmacy.    Lab work: No new labs needed   Testing/Procedures: - Your physician has requested that you have a carotid duplex. This test is an ultrasound of the carotid arteries in your neck. It looks at blood flow through these arteries that supply the brain with blood. Allow one hour for this exam. There are no restrictions or special instructions.   Follow-Up: At [facility], you and your health needs are our priority.  As part of our continuing mission to provide you with exceptional heart care, we have created designated Provider Care Teams.  These Care Teams include your primary Cardiologist (physician) and Advanced Practice Providers (APPs -  Physician Assistants and Nurse Practitioners) who all work together to provide you with the care you need, when you need it.  You will need a follow up appointment in 12 months  Providers on your designated Care Team:   Murray Hodgkins, NP Christell Faith, PA-C Cadence Kathlen Mody, [place]  COVID-19 Vaccine Information can be found at: [email or web address] For questions related to vaccine distribution or appointments, please email [email or web address] or call [phone].

## 2022-03-03 ENCOUNTER — Ambulatory Visit: Payer: 59 | Attending: Cardiovascular Disease

## 2022-03-03 DIAGNOSIS — R0989 Other specified symptoms and signs involving the circulatory and respiratory systems: Secondary | ICD-10-CM

## 2022-03-20 ENCOUNTER — Other Ambulatory Visit: Payer: Self-pay

## 2022-03-20 DIAGNOSIS — E0500 Thyrotoxicosis with diffuse goiter without thyrotoxic crisis or storm: Secondary | ICD-10-CM

## 2022-03-24 ENCOUNTER — Other Ambulatory Visit: Payer: Self-pay | Admitting: Internal Medicine

## 2022-03-24 DIAGNOSIS — E0500 Thyrotoxicosis with diffuse goiter without thyrotoxic crisis or storm: Secondary | ICD-10-CM

## 2022-03-24 DIAGNOSIS — E213 Hyperparathyroidism, unspecified: Secondary | ICD-10-CM

## 2022-03-30 ENCOUNTER — Other Ambulatory Visit: Payer: Self-pay | Admitting: Physician Assistant

## 2022-04-09 ENCOUNTER — Ambulatory Visit: Payer: 59 | Admitting: Physician Assistant

## 2022-04-09 ENCOUNTER — Encounter
Admission: RE | Admit: 2022-04-09 | Discharge: 2022-04-09 | Disposition: A | Discharge status: Home / Self Care | Payer: 59 | Source: Ambulatory Visit | Attending: Internal Medicine | Admitting: Internal Medicine

## 2022-04-09 DIAGNOSIS — E0500 Thyrotoxicosis with diffuse goiter without thyrotoxic crisis or storm: Secondary | ICD-10-CM | POA: Diagnosis not present

## 2022-04-09 MED ORDER — SODIUM IODIDE I-123 7.4 MBQ CAPS
337.0000 | ORAL_CAPSULE | Freq: Once | ORAL | Status: AC
  Administered 2022-04-09: 324.56 via ORAL

## 2022-04-10 ENCOUNTER — Encounter
Admission: RE | Admit: 2022-04-10 | Discharge: 2022-04-10 | Disposition: A | Discharge status: Home / Self Care | Payer: 59 | Source: Ambulatory Visit | Attending: Internal Medicine | Admitting: Internal Medicine

## 2022-04-13 NOTE — Written Directive (Addendum)
MOLECULAR IMAGING AND THERAPEUTICS WRITTEN DIRECTIVE   PATIENT NAME: Deanna Howard  PT DOB:   Apr 10, 1965                                              MRN: [id number]  ---------------------------------------------------------------------------------------------------------------------   I-131 WHOLE THYROID THERAPY (NON-CANCER)    RADIOPHARMACEUTICAL:   Iodine-131 Capsule    PRESCRIBED DOSE FOR ADMINISTRATION: 13 mCi   ROUTE OF ADMINISTRATION: PO   DIAGNOSIS:  Graves Disease E05.00   Hyperparathyroidism E21.3   REFERRING PHYSICIAN: Germain Osgood MD    TSH:    Lab Results  Component Value Date   TSH <0.005 (L) 06/19/2021   TSH 1.000 07/16/2020     PRIOR I-131 THERAPY (Date and Dose):   PRIOR RADIOLOGY EXAMS (Results and Date): 04/10/2022 NM THYROID MULT UPTAKE W/IMAGING  Result Date: 04/10/2022 CLINICAL DATA:  Graves disease.  Hyperthyroidism. EXAM: THYROID SCAN AND UPTAKE - 4 AND 24 HOURS TECHNIQUE: Following oral administration of I-123 capsule, anterior planar imaging was acquired at 24 hours. Thyroid uptake was calculated with a thyroid probe at 4-6 hours and 24 hours. RADIOPHARMACEUTICALS:  324.56 uCi I-123 sodium iodide p.o. COMPARISON:  None Available. FINDINGS: Homogeneous uptake is identified throughout the thyroid with no nodules or masses. A pyramidal lobe is incidentally identified. 4 hour I-123 uptake = 85.7% (normal 5-20%) 24 hour I-123 uptake = 76.3% (normal 10-30%) IMPRESSION: The findings are consistent with the reported Graves disease. Markedly elevated iodine uptake is identified in the thyroid at 4 and 24 hours. Electronically Signed   By: Dorise Bullion III M.D.   On: 04/10/2022 10:15      ADDITIONAL PHYSICIAN COMMENTS/NOTES 57 year old female with hyperthyroid symptoms.  Longstanding depressed TSH.  Imaging and I 131 uptake consistent with Graves disease (uptake equal 76%).     AUTHORIZED USER SIGNATURE & TIME STAMP: Rennis Golden, MD    04/15/22    12:37 PM

## 2022-04-23 ENCOUNTER — Encounter
Admission: RE | Admit: 2022-04-23 | Discharge: 2022-04-23 | Disposition: A | Discharge status: Home / Self Care | Payer: 59 | Source: Ambulatory Visit | Attending: Internal Medicine | Admitting: Internal Medicine

## 2022-04-23 DIAGNOSIS — E0500 Thyrotoxicosis with diffuse goiter without thyrotoxic crisis or storm: Secondary | ICD-10-CM

## 2022-04-23 DIAGNOSIS — E213 Hyperparathyroidism, unspecified: Secondary | ICD-10-CM | POA: Diagnosis present

## 2022-04-23 MED ORDER — SODIUM IODIDE I 131 CAPSULE
13.2500 | Freq: Once | INTRAVENOUS | Status: AC | PRN
  Administered 2022-04-23: 13.23 via ORAL

## 2022-05-07 ENCOUNTER — Other Ambulatory Visit: Payer: Self-pay | Admitting: Physician Assistant

## 2022-05-07 ENCOUNTER — Ambulatory Visit (INDEPENDENT_AMBULATORY_CARE_PROVIDER_SITE_OTHER): Payer: 59 | Admitting: Physician Assistant

## 2022-05-07 ENCOUNTER — Encounter: Payer: Self-pay | Admitting: Physician Assistant

## 2022-05-07 VITALS — BP 126/78 | HR 93 | Temp 98.5°F | Resp 16 | Ht 64.0 in | Wt 160.2 lb

## 2022-05-07 DIAGNOSIS — E0500 Thyrotoxicosis with diffuse goiter without thyrotoxic crisis or storm: Secondary | ICD-10-CM

## 2022-05-07 DIAGNOSIS — F411 Generalized anxiety disorder: Secondary | ICD-10-CM | POA: Diagnosis not present

## 2022-05-07 DIAGNOSIS — I10 Essential (primary) hypertension: Secondary | ICD-10-CM | POA: Diagnosis not present

## 2022-05-07 DIAGNOSIS — Z923 Personal history of irradiation: Secondary | ICD-10-CM | POA: Diagnosis not present

## 2022-05-07 MED ORDER — ALPRAZOLAM 0.25 MG PO TABS: ORAL_TABLET | ORAL | 1 refills | Status: DC

## 2022-05-07 MED ORDER — MELOXICAM 15 MG PO TABS: 15.0000 mg | ORAL_TABLET | Freq: Every day | ORAL | 3 refills | Status: AC

## 2022-05-07 MED ORDER — PROPRANOLOL HCL ER 60 MG PO CP24: 60.0000 mg | ORAL_CAPSULE | Freq: Every day | ORAL | 11 refills | Status: DC

## 2022-05-07 MED ORDER — FLUOXETINE HCL 10 MG PO CAPS: ORAL_CAPSULE | ORAL | 1 refills | Status: DC

## 2022-05-07 MED ORDER — BUPROPION HCL ER (XL) 150 MG PO TB24: 150.0000 mg | ORAL_TABLET | Freq: Every day | ORAL | 1 refills | Status: DC

## 2022-05-07 MED ORDER — SUCRALFATE 1 G PO TABS: 1.0000 g | ORAL_TABLET | Freq: Every day | ORAL | 0 refills | Status: DC | PRN

## 2022-05-07 NOTE — Progress Notes (Signed)
[facility] [place], [place] [address]  Internal MEDICINE  Office Visit Note  Patient Name: Deanna Howard  [id number]  [id number]  Date of Service: 05/07/2022  Chief Complaint  Patient presents with   Follow-up   Depression   Hyperlipidemia   Medication Refill    HPI Pt is here for routine follow up -Had radioactive iodine therapy in Jan for her graves disease and is feeling better now. Her goiter has reduced in size and pt states her symptoms have improved and is not having any SOB or swallowing difficulty upon raising her arms anymore. She follows up with endo in March. -She does need med refills today and requests restarting wellbutrin. It has been a difficult year and her 2 sons are deployed and would like to restart on this to help while continuing prozac -BP stable, does request propranolol refill for '60mg'$  LA -Uses carafate only rarely but does request to have this on hand -Sinuses have been acting up more recently and is using nasal spray to help  Current Medication: Outpatient Encounter Medications as of 05/07/2022  Medication Sig   aspirin-acetaminophen-caffeine (EXCEDRIN MIGRAINE) 250-250-65 MG tablet Take 2 tablets by mouth every 6 (six) hours as needed for headache.   buPROPion (WELLBUTRIN XL) 150 MG 24 hr tablet Take 1 tablet (150 mg total) by mouth daily.   furosemide (LASIX) 20 MG tablet Take 1 tablet (20 mg total) by mouth daily as needed (leg swelling).   methimazole (TAPAZOLE) 10 MG tablet Take 10 mg by mouth daily.   potassium chloride SA (KLOR-CON M) 20 MEQ tablet Take 1 tablet (20 mEq total) by mouth daily as needed (for leg swelling).   propranolol (INDERAL) 20 MG tablet Take 1 tablet (20 mg total) by mouth 3 (three) times daily as needed.   [DISCONTINUED] ALPRAZolam (XANAX) 0.25 MG tablet TAKE HALF TO ONE TAB 2 X DAY (Patient taking differently: Take 0.125-0.25 mg by mouth 2 (two) times daily as needed for anxiety.)   [DISCONTINUED]  FLUoxetine (PROZAC) 10 MG capsule TAKE 1 CAPSULE(10 MG) BY MOUTH DAILY   [DISCONTINUED] meloxicam (MOBIC) 15 MG tablet Take 1 tablet (15 mg total) by mouth daily.   [DISCONTINUED] propranolol ER (INDERAL LA) 60 MG 24 hr capsule Take 60 mg by mouth daily.   [DISCONTINUED] sucralfate (CARAFATE) 1 g tablet Take 1 g by mouth daily as needed (nausea, abdominal pain).   ALPRAZolam (XANAX) 0.25 MG tablet TAKE HALF TO ONE TAB 2 X DAY as needed   FLUoxetine (PROZAC) 10 MG capsule TAKE 1 CAPSULE(10 MG) BY MOUTH DAILY   meloxicam (MOBIC) 15 MG tablet Take 1 tablet (15 mg total) by mouth daily.   propranolol ER (INDERAL LA) 60 MG 24 hr capsule Take 1 capsule (60 mg total) by mouth daily.   sucralfate (CARAFATE) 1 g tablet Take 1 tablet (1 g total) by mouth daily as needed (nausea, abdominal pain).   No facility-administered encounter medications on file as of 05/07/2022.    Surgical History: Past Surgical History:  Procedure Laterality Date   APPENDECTOMY     CHOLECYSTECTOMY     COLON SURGERY     removed tumor from colon   COLONOSCOPY     HIP SURGERY     cartiledge repair, shaving of the bone.   ROTATOR CUFF REPAIR     SPINAL FUSION     UPPER GASTROINTESTINAL ENDOSCOPY      Medical History: Past Medical History:  Diagnosis Date   Depression  Femoroacetabular impingement of both hips    History of duodenal ulcer    Hyperlipidemia    Osteoarthritis    Thyroid disease     Family History: Family History  Problem Relation Age of Onset   Neuropathy Mother    Hypertension Mother    Heart disease Father    Kidney disease Father    Hypertension Father    Heart attack Father    COPD Father    Hypertension Brother    Heart disease Brother    Heart disease Brother    Hypertension Brother    Heart disease Brother    Hypertension Brother     Social History   Socioeconomic History   Marital status: Married    Spouse name: Not on file   Number of children: Not on file   Years of  education: Not on file   Highest education level: Not on file  Occupational History   Not on file  Tobacco Use   Smoking status: Every Day    Packs/day: 1.00    Years: 30.00    Total pack years: 30.00    Types: Cigarettes   Smokeless tobacco: Never   Tobacco comments:    Smokes 1 pack daily  Vaping Use   Vaping Use: Never used  Substance and Sexual Activity   Alcohol use: Yes    Alcohol/week: 3.0 standard drinks of alcohol    Types: 3 Cans of beer per week    Comment: 3-4 beers a couple times a week   Drug use: Never   Sexual activity: Not on file  Other Topics Concern   Not on file  Social History Narrative   Not on file   Social Determinants of Health   Financial Resource Strain: Not on file  Food Insecurity: Not on file  Transportation Needs: Not on file  Physical Activity: Not on file  Stress: Not on file  Social Connections: Not on file  Intimate Partner Violence: Not on file      Review of Systems  Constitutional:  Negative for chills, fatigue and unexpected weight change.  HENT:  Positive for postnasal drip. Negative for congestion, rhinorrhea, sneezing and sore throat.   Eyes:  Negative for redness.  Respiratory:  Negative for cough, chest tightness and shortness of breath.   Cardiovascular:  Negative for chest pain and palpitations.  Gastrointestinal:  Negative for abdominal pain, constipation, diarrhea, nausea and vomiting.  Genitourinary:  Negative for dysuria and frequency.  Musculoskeletal:  Negative for arthralgias, back pain, joint swelling and neck pain.  Skin:  Negative for rash.  Neurological: Negative.  Negative for tremors and numbness.  Hematological:  Negative for adenopathy. Does not bruise/bleed easily.  Psychiatric/Behavioral:  Negative for behavioral problems (Depression), sleep disturbance and suicidal ideas. The patient is not nervous/anxious.     Vital Signs: BP 126/78   Pulse 93   Temp 98.5 F (36.9 C)   Resp 16   Ht '5\' 4"'$   (1.626 m)   Wt 160 lb 3.2 oz (72.7 kg)   SpO2 97%   BMI 27.50 kg/m    Physical Exam Vitals and nursing note reviewed.  Constitutional:      General: She is not in acute distress.    Appearance: Normal appearance. She is well-developed. She is not diaphoretic.  HENT:     Head: Normocephalic and atraumatic.     Mouth/Throat:     Pharynx: No oropharyngeal exudate.  Eyes:     Pupils: Pupils are equal, round,  and reactive to light.  Neck:     Thyroid: No thyromegaly.     Vascular: No JVD.     Trachea: No tracheal deviation.  Cardiovascular:     Rate and Rhythm: Normal rate and regular rhythm.     Heart sounds: Normal heart sounds. No murmur heard.    No friction rub. No gallop.  Pulmonary:     Effort: Pulmonary effort is normal. No respiratory distress.     Breath sounds: No wheezing or rales.  Chest:     Chest wall: No tenderness.  Abdominal:     General: Bowel sounds are normal.     Palpations: Abdomen is soft.  Musculoskeletal:        General: Normal range of motion.     Cervical back: Normal range of motion and neck supple.  Lymphadenopathy:     Cervical: No cervical adenopathy.  Skin:    General: Skin is warm and dry.  Neurological:     Mental Status: She is alert and oriented to person, place, and time.     Cranial Nerves: No cranial nerve deficit.  Psychiatric:        Behavior: Behavior normal.        Thought Content: Thought content normal.        Judgment: Judgment normal.        Assessment/Plan: 1. Benign hypertension Stable, currently on propranolol LA and only uses lasix prn  2. GAD (generalized anxiety disorder) Will restart wellbutrin and continue prozac and xanax as before. She only uses xanax prn - FLUoxetine (PROZAC) 10 MG capsule; TAKE 1 CAPSULE(10 MG) BY MOUTH DAILY  Dispense: 90 capsule; Refill: 1 - buPROPion (WELLBUTRIN XL) 150 MG 24 hr tablet; Take 1 tablet (150 mg total) by mouth daily.  Dispense: 90 tablet; Refill: 1 - ALPRAZolam  (XANAX) 0.25 MG tablet; TAKE HALF TO ONE TAB 2 X DAY as needed  Dispense: 45 tablet; Refill: 1  3. Graves' disease S/p RAI - propranolol ER (INDERAL LA) 60 MG 24 hr capsule; Take 1 capsule (60 mg total) by mouth daily.  Dispense: 30 capsule; Refill: 11  4.  Status post radioactive iodine thyroid ablation Followed by endocrinology  General Counseling: Rudean Curt understanding of the findings of todays visit and agrees with plan of treatment. I have discussed any further diagnostic evaluation that may be needed or ordered today. We also reviewed her medications today. she has been encouraged to call the office with any questions or concerns that should arise related to todays visit.    No orders of the defined types were placed in this encounter.   Meds ordered this encounter  Medications   FLUoxetine (PROZAC) 10 MG capsule    Sig: TAKE 1 CAPSULE(10 MG) BY MOUTH DAILY    Dispense:  90 capsule    Refill:  1   buPROPion (WELLBUTRIN XL) 150 MG 24 hr tablet    Sig: Take 1 tablet (150 mg total) by mouth daily.    Dispense:  90 tablet    Refill:  1   propranolol ER (INDERAL LA) 60 MG 24 hr capsule    Sig: Take 1 capsule (60 mg total) by mouth daily.    Dispense:  30 capsule    Refill:  11   sucralfate (CARAFATE) 1 g tablet    Sig: Take 1 tablet (1 g total) by mouth daily as needed (nausea, abdominal pain).    Dispense:  30 tablet    Refill:  0  ALPRAZolam (XANAX) 0.25 MG tablet    Sig: TAKE HALF TO ONE TAB 2 X DAY as needed    Dispense:  45 tablet    Refill:  1   meloxicam (MOBIC) 15 MG tablet    Sig: Take 1 tablet (15 mg total) by mouth daily.    Dispense:  30 tablet    Refill:  3    This patient was seen by Drema Dallas, PA-C in collaboration with Dr. Clayborn Bigness as a part of collaborative care agreement.   Total time spent:30 Minutes Time spent includes review of chart, medications, test results, and follow up plan with the patient.      Dr Lavera Guise Internal medicine

## 2022-06-04 ENCOUNTER — Other Ambulatory Visit: Payer: Self-pay | Admitting: Physician Assistant

## 2022-06-29 ENCOUNTER — Other Ambulatory Visit: Payer: Self-pay | Admitting: Cardiovascular Disease

## 2022-10-25 ENCOUNTER — Other Ambulatory Visit: Payer: Self-pay | Admitting: Cardiovascular Disease

## 2022-10-26 ENCOUNTER — Other Ambulatory Visit: Payer: Self-pay | Admitting: Internal Medicine

## 2022-10-26 ENCOUNTER — Encounter: Payer: Self-pay | Admitting: Physician Assistant

## 2022-10-26 ENCOUNTER — Ambulatory Visit (INDEPENDENT_AMBULATORY_CARE_PROVIDER_SITE_OTHER): Payer: 59 | Admitting: Physician Assistant

## 2022-10-26 ENCOUNTER — Telehealth: Payer: Self-pay | Admitting: Physician Assistant

## 2022-10-26 VITALS — BP 138/80 | HR 94 | Temp 98.0°F | Resp 16 | Ht 64.0 in | Wt 165.0 lb

## 2022-10-26 DIAGNOSIS — R110 Nausea: Secondary | ICD-10-CM | POA: Diagnosis not present

## 2022-10-26 DIAGNOSIS — F411 Generalized anxiety disorder: Secondary | ICD-10-CM | POA: Diagnosis not present

## 2022-10-26 DIAGNOSIS — R5383 Other fatigue: Secondary | ICD-10-CM

## 2022-10-26 DIAGNOSIS — K2100 Gastro-esophageal reflux disease with esophagitis, without bleeding: Secondary | ICD-10-CM | POA: Diagnosis not present

## 2022-10-26 DIAGNOSIS — M7989 Other specified soft tissue disorders: Secondary | ICD-10-CM

## 2022-10-26 DIAGNOSIS — E042 Nontoxic multinodular goiter: Secondary | ICD-10-CM

## 2022-10-26 DIAGNOSIS — F17219 Nicotine dependence, cigarettes, with unspecified nicotine-induced disorders: Secondary | ICD-10-CM

## 2022-10-26 DIAGNOSIS — L989 Disorder of the skin and subcutaneous tissue, unspecified: Secondary | ICD-10-CM

## 2022-10-26 DIAGNOSIS — I10 Essential (primary) hypertension: Secondary | ICD-10-CM

## 2022-10-26 DIAGNOSIS — E0500 Thyrotoxicosis with diffuse goiter without thyrotoxic crisis or storm: Secondary | ICD-10-CM

## 2022-10-26 DIAGNOSIS — E538 Deficiency of other specified B group vitamins: Secondary | ICD-10-CM

## 2022-10-26 DIAGNOSIS — R300 Dysuria: Secondary | ICD-10-CM

## 2022-10-26 DIAGNOSIS — D4989 Neoplasm of unspecified behavior of other specified sites: Secondary | ICD-10-CM

## 2022-10-26 DIAGNOSIS — E7800 Pure hypercholesterolemia, unspecified: Secondary | ICD-10-CM

## 2022-10-26 DIAGNOSIS — Z0001 Encounter for general adult medical examination with abnormal findings: Secondary | ICD-10-CM | POA: Diagnosis not present

## 2022-10-26 DIAGNOSIS — Z1231 Encounter for screening mammogram for malignant neoplasm of breast: Secondary | ICD-10-CM

## 2022-10-26 DIAGNOSIS — Z8719 Personal history of other diseases of the digestive system: Secondary | ICD-10-CM

## 2022-10-26 DIAGNOSIS — E559 Vitamin D deficiency, unspecified: Secondary | ICD-10-CM

## 2022-10-26 MED ORDER — SUCRALFATE 1 G PO TABS: 1.0000 g | ORAL_TABLET | Freq: Every day | ORAL | 0 refills | Status: AC | PRN

## 2022-10-26 MED ORDER — ONDANSETRON HCL 4 MG PO TABS: 4.0000 mg | ORAL_TABLET | Freq: Three times a day (TID) | ORAL | 0 refills | Status: AC | PRN

## 2022-10-26 MED ORDER — FUROSEMIDE 20 MG PO TABS: 20.0000 mg | ORAL_TABLET | Freq: Every day | ORAL | 1 refills | Status: DC

## 2022-10-26 MED ORDER — ALPRAZOLAM 0.25 MG PO TABS
ORAL_TABLET | ORAL | 1 refills | Status: DC
Start: 2022-10-26 — End: 2023-04-28

## 2022-10-26 NOTE — Telephone Encounter (Signed)
GI referral sent via Epic to [facility]. Awaiting 10/26/22 office notes for Dermatology-[facility]

## 2022-10-26 NOTE — Progress Notes (Signed)
[facility] [address] [place], [place] [address]  Internal MEDICINE  Office Visit Note  Patient Name: Deanna Howard  [id number]  [id number]  Date of Service: 10/27/2022  Chief Complaint  Patient presents with   Annual Exam   Hyperlipidemia   Depression   Quality Metric Gaps     HPI Pt is here for routine health maintenance examination -mammogram due, labs due -A lot nausea and gerd and has been taking carafate when needed. Requests some zofran due to nausea. She has a history of esophageal spasms and requests referral back to GI. -with regards to possible thymoma, ENT did not say anything--they focused on thyroid and wanted to remove it, neuro did not say anything either except that MG unlikely and sleep study should be considered for fatigue. Will order a repat CT chest for monitoring of this as well as a screening for history of smoking. She states if a MRI would needed in future that she would have to be sedated with IV sedation. -A little swelling over medical aspect of Right clavicle as well and will evaluate this on CT chest as well -smoking 1/2ppd, has been smoking for 5 years, had previously stopped for 10 years, but had been smoking prior to that gap therefore still a long history of smoking-Irreg HR at endo and states the EKG showed some premature beats.  -She takes propranolol 60mg  daily. Not taking the 20mg  prn anymore  Current Medication: Outpatient Encounter Medications as of 10/26/2022  Medication Sig   aspirin-acetaminophen-caffeine (EXCEDRIN MIGRAINE) 250-250-65 MG tablet Take 2 tablets by mouth every 6 (six) hours as needed for headache.   buPROPion (WELLBUTRIN XL) 150 MG 24 hr tablet Take 1 tablet (150 mg total) by mouth daily.   FLUoxetine (PROZAC) 10 MG capsule TAKE 1 CAPSULE(10 MG) BY MOUTH DAILY   meloxicam (MOBIC) 15 MG tablet Take 1 tablet (15 mg total) by mouth daily.   methimazole (TAPAZOLE) 10 MG tablet Take 10 mg by mouth daily.    ondansetron (ZOFRAN) 4 MG tablet Take 1 tablet (4 mg total) by mouth every 8 (eight) hours as needed for nausea or vomiting.   potassium chloride SA (KLOR-CON M) 20 MEQ tablet Take 1 tablet (20 mEq total) by mouth daily as needed (for leg swelling).   propranolol ER (INDERAL LA) 60 MG 24 hr capsule Take 1 capsule (60 mg total) by mouth daily.   [DISCONTINUED] ALPRAZolam (XANAX) 0.25 MG tablet TAKE HALF TO ONE TAB 2 X DAY as needed   [DISCONTINUED] furosemide (LASIX) 20 MG tablet TAKE 1 TABLET(20 MG) BY MOUTH DAILY AS NEEDED FOR LEG SWELLING   [DISCONTINUED] propranolol (INDERAL) 20 MG tablet Take 1 tablet (20 mg total) by mouth 3 (three) times daily as needed.   [DISCONTINUED] sucralfate (CARAFATE) 1 g tablet TAKE 1 TABLET(1 GRAM) BY MOUTH DAILY AS NEEDED FOR NAUSEA OR ABDOMINAL PAIN   ALPRAZolam (XANAX) 0.25 MG tablet TAKE HALF TO ONE TAB 2 X DAY as needed   furosemide (LASIX) 20 MG tablet Take 1 tablet (20 mg total) by mouth daily.   sucralfate (CARAFATE) 1 g tablet Take 1 tablet (1 g total) by mouth daily as needed. TAKE 1 TABLET(1 GRAM) BY MOUTH DAILY AS NEEDED FOR NAUSEA OR ABDOMINAL PAIN   No facility-administered encounter medications on file as of 10/26/2022.    Surgical History: Past Surgical History:  Procedure Laterality Date   APPENDECTOMY     CHOLECYSTECTOMY     COLON SURGERY  removed tumor from colon   COLONOSCOPY     HIP SURGERY     cartiledge repair, shaving of the bone.   ROTATOR CUFF REPAIR     SPINAL FUSION     UPPER GASTROINTESTINAL ENDOSCOPY      Medical History: Past Medical History:  Diagnosis Date   Depression    Femoroacetabular impingement of both hips    History of duodenal ulcer    Hyperlipidemia    Osteoarthritis    Thyroid disease     Family History: Family History  Problem Relation Age of Onset   Neuropathy Mother    Hypertension Mother    Heart disease Father    Kidney disease Father    Hypertension Father    Heart attack Father     COPD Father    Hypertension Brother    Heart disease Brother    Heart disease Brother    Hypertension Brother    Heart disease Brother    Hypertension Brother       Review of Systems  Constitutional:  Positive for fatigue. Negative for chills and unexpected weight change.  HENT:  Negative for congestion, postnasal drip, rhinorrhea, sneezing and sore throat.   Eyes:  Negative for redness.  Respiratory:  Negative for cough, chest tightness and shortness of breath.   Cardiovascular:  Negative for chest pain and palpitations.  Gastrointestinal:  Positive for abdominal pain and nausea. Negative for constipation, diarrhea and vomiting.  Genitourinary:  Negative for dysuria and frequency.  Musculoskeletal:  Negative for arthralgias, back pain, joint swelling and neck pain.  Skin:  Negative for rash.  Neurological: Negative.  Negative for tremors and numbness.  Hematological:  Negative for adenopathy. Does not bruise/bleed easily.  Psychiatric/Behavioral:  Negative for behavioral problems (Depression), sleep disturbance and suicidal ideas. The patient is not nervous/anxious.      Vital Signs: BP 138/80   Pulse 94   Temp 98 F (36.7 C)   Resp 16   Ht 5\' 4"  (1.626 m)   Wt 165 lb (74.8 kg)   SpO2 98%   BMI 28.32 kg/m    Physical Exam Vitals and nursing note reviewed.  Constitutional:      General: She is not in acute distress.    Appearance: Normal appearance. She is well-developed. She is not diaphoretic.  HENT:     Head: Normocephalic and atraumatic.     Mouth/Throat:     Pharynx: No oropharyngeal exudate.  Eyes:     Pupils: Pupils are equal, round, and reactive to light.  Neck:     Thyroid: No thyromegaly.     Vascular: No JVD.     Trachea: No tracheal deviation.  Cardiovascular:     Rate and Rhythm: Normal rate and regular rhythm.     Heart sounds: Normal heart sounds. No murmur heard.    No friction rub. No gallop.  Pulmonary:     Effort: Pulmonary effort is  normal. No respiratory distress.     Breath sounds: No wheezing or rales.  Chest:     Chest wall: No tenderness.  Abdominal:     General: Bowel sounds are normal.     Palpations: Abdomen is soft.     Tenderness: There is no abdominal tenderness.  Musculoskeletal:        General: Normal range of motion.     Cervical back: Normal range of motion and neck supple.     Comments: Mild swelling overlying medial right clavicle  Lymphadenopathy:  Cervical: No cervical adenopathy.  Skin:    General: Skin is warm and dry.     Findings: Lesion present.     Comments: Skin lesion along right arm, new and changing, and bothersome to patient  Neurological:     Mental Status: She is alert and oriented to person, place, and time.     Cranial Nerves: No cranial nerve deficit.  Psychiatric:        Behavior: Behavior normal.        Thought Content: Thought content normal.        Judgment: Judgment normal.      LABS: Recent Results (from the past 2160 hour(s))  UA/M w/rflx Culture, Routine     Status: None   Collection Time: 10/26/22 11:05 AM   Specimen: Urine   Urine  Result Value Ref Range   Specific Gravity, UA 1.006 1.005 - 1.030   pH, UA 5.5 5.0 - 7.5   Color, UA Yellow Yellow   Appearance Ur Clear Clear   Leukocytes,UA Negative Negative   Protein,UA Negative Negative/Trace   Glucose, UA Negative Negative   Ketones, UA Negative Negative   RBC, UA Negative Negative   Bilirubin, UA Negative Negative   Urobilinogen, Ur 0.2 0.2 - 1.0 mg/dL   Nitrite, UA Negative Negative   Microscopic Examination Comment     Comment: Microscopic follows if indicated.   Microscopic Examination See below:     Comment: Microscopic was indicated and was performed.   Urinalysis Reflex Comment     Comment: This specimen will not reflex to a Urine Culture.  Microscopic Examination     Status: None   Collection Time: 10/26/22 11:05 AM   Urine  Result Value Ref Range   WBC, UA 0-5 0 - 5 /hpf   RBC,  Urine None seen 0 - 2 /hpf   Epithelial Cells (non renal) 0-10 0 - 10 /hpf   Casts None seen None seen /lpf   Bacteria, UA None seen None seen/Few        Assessment/Plan: 1. Encounter for general adult medical examination with abnormal findings Cpe performed, labs ordered, due for mammogram   2. Benign hypertension Stable, continue current medication  3. GAD (generalized anxiety disorder) - ALPRAZolam (XANAX) 0.25 MG tablet; TAKE HALF TO ONE TAB 2 X DAY as needed  Dispense: 30 tablet; Refill: 1  4. Nausea - Ambulatory referral to Gastroenterology  5. Gastroesophageal reflux disease with esophagitis, unspecified whether hemorrhage - Ambulatory referral to Gastroenterology  6. History of esophageal spasm - Ambulatory referral to Gastroenterology  7. Visit for screening mammogram - MM 3D SCREENING MAMMOGRAM BILATERAL BREAST; Future  8. Skin lesion of right arm - Ambulatory referral to Dermatology  9. Pure hypercholesterolemia - Lipid Panel With LDL/HDL Ratio  10. Vitamin D deficiency - VITAMIN D 25 Hydroxy (Vit-D Deficiency, Fractures)  11. B12 deficiency - B12 and Folate Panel  12. Cigarette nicotine dependence with nicotine-induced disorder Will order CT chest for lung screening due to smoking as well as thymoma - CT Chest Wo Contrast; Future  13. Thymoma Will order CT to monitor - CT Chest Wo Contrast; Future  14. Swelling of clavicular region Will order CT to evaluate - CT Chest Wo Contrast; Future  15. Other fatigue - CBC w/Diff/Platelet - Comprehensive metabolic panel - VITAMIN D 25 Hydroxy (Vit-D Deficiency, Fractures) - Fe+TIBC+Fer - B12 and Folate Panel - Lipid Panel With LDL/HDL Ratio  16. Dysuria - UA/M w/rflx Culture, Routine - Microscopic Examination  General Counseling: bryon olden understanding of the findings of todays visit and agrees with plan of treatment. I have discussed any further diagnostic evaluation that may be needed  or ordered today. We also reviewed her medications today. she has been encouraged to call the office with any questions or concerns that should arise related to todays visit.    Counseling:    Orders Placed This Encounter  Procedures   Microscopic Examination   MM 3D SCREENING MAMMOGRAM BILATERAL BREAST   CT Chest Wo Contrast   UA/M w/rflx Culture, Routine   CBC w/Diff/Platelet   Comprehensive metabolic panel   VITAMIN D 25 Hydroxy (Vit-D Deficiency, Fractures)   Fe+TIBC+Fer   B12 and Folate Panel   Lipid Panel With LDL/HDL Ratio   Ambulatory referral to Gastroenterology   Ambulatory referral to Dermatology    Meds ordered this encounter  Medications   furosemide (LASIX) 20 MG tablet    Sig: Take 1 tablet (20 mg total) by mouth daily.    Dispense:  90 tablet    Refill:  1   sucralfate (CARAFATE) 1 g tablet    Sig: Take 1 tablet (1 g total) by mouth daily as needed. TAKE 1 TABLET(1 GRAM) BY MOUTH DAILY AS NEEDED FOR NAUSEA OR ABDOMINAL PAIN    Dispense:  90 tablet    Refill:  0    **Patient requests 90 days supply**   ondansetron (ZOFRAN) 4 MG tablet    Sig: Take 1 tablet (4 mg total) by mouth every 8 (eight) hours as needed for nausea or vomiting.    Dispense:  20 tablet    Refill:  0   ALPRAZolam (XANAX) 0.25 MG tablet    Sig: TAKE HALF TO ONE TAB 2 X DAY as needed    Dispense:  30 tablet    Refill:  1    This patient was seen by Lynn Ito, PA-C in collaboration with Dr. Beverely Risen as a part of collaborative care agreement.  Total time spent:45 Minutes  Time spent includes review of chart, medications, test results, and follow up plan with the patient.     Lyndon Code, MD  Internal Medicine

## 2022-10-28 ENCOUNTER — Other Ambulatory Visit: Payer: Self-pay | Admitting: Internal Medicine

## 2022-10-28 ENCOUNTER — Telehealth: Payer: Self-pay | Admitting: Physician Assistant

## 2022-10-28 DIAGNOSIS — E042 Nontoxic multinodular goiter: Secondary | ICD-10-CM

## 2022-10-28 DIAGNOSIS — E0500 Thyrotoxicosis with diffuse goiter without thyrotoxic crisis or storm: Secondary | ICD-10-CM

## 2022-10-28 NOTE — Telephone Encounter (Signed)
Dermatology appointment 11/18/2022 @ [facility]

## 2022-10-28 NOTE — Telephone Encounter (Signed)
Dermatology referral sent via [facility] to [facility]. Notified patient. Gave telephone # [phone]. Sheralyn Boatman

## 2022-10-30 ENCOUNTER — Telehealth: Payer: Self-pay

## 2022-10-30 NOTE — Telephone Encounter (Signed)
Spoke with patient regarding labs.

## 2022-10-30 NOTE — Telephone Encounter (Signed)
-----   Message from Carlean Jews sent at 10/28/2022  3:13 PM EDT ----- Please let her know that 2 of her liver enzymes were slightly elevated, liver looked ok on imaging a few years ago with GI, but they may monitor this further now that she is going to be going back to see them. Cholesterol did go back up slightly so continue to work on this, otherwise labs look good.

## 2022-11-06 ENCOUNTER — Other Ambulatory Visit: Payer: Self-pay

## 2022-11-11 ENCOUNTER — Other Ambulatory Visit: Payer: Self-pay | Admitting: Physician Assistant

## 2022-11-11 ENCOUNTER — Other Ambulatory Visit: Payer: Self-pay

## 2022-11-11 ENCOUNTER — Ambulatory Visit: Payer: 59 | Admitting: Gastroenterology

## 2022-11-11 ENCOUNTER — Encounter: Payer: Self-pay | Admitting: Gastroenterology

## 2022-11-11 VITALS — BP 155/92 | HR 76 | Temp 98.1°F | Ht 64.0 in | Wt 167.0 lb

## 2022-11-11 DIAGNOSIS — R1319 Other dysphagia: Secondary | ICD-10-CM

## 2022-11-11 DIAGNOSIS — R140 Abdominal distension (gaseous): Secondary | ICD-10-CM

## 2022-11-11 DIAGNOSIS — Z8711 Personal history of peptic ulcer disease: Secondary | ICD-10-CM

## 2022-11-11 DIAGNOSIS — F1090 Alcohol use, unspecified, uncomplicated: Secondary | ICD-10-CM

## 2022-11-11 DIAGNOSIS — R748 Abnormal levels of other serum enzymes: Secondary | ICD-10-CM | POA: Diagnosis not present

## 2022-11-11 DIAGNOSIS — F17210 Nicotine dependence, cigarettes, uncomplicated: Secondary | ICD-10-CM

## 2022-11-11 DIAGNOSIS — R7989 Other specified abnormal findings of blood chemistry: Secondary | ICD-10-CM

## 2022-11-11 DIAGNOSIS — R1310 Dysphagia, unspecified: Secondary | ICD-10-CM

## 2022-11-11 NOTE — Progress Notes (Signed)
Arlyss Repress, MD [address]  [address]  [place], [place] [address]  Main: [phone]  Fax: [phone]    Gastroenterology Consultation  Referring Provider:     Carlean Jews, PA* Primary Care Physician:  Carlean Jews, PA-C Primary Gastroenterologist:  Dr. Arlyss Repress Reason for Consultation: Food stuck in the chest, bloating, spasm        HPI:   Deanna Howard is a 57 y.o. female referred by Carlean Jews, PA-C  for consultation & management of chronic symptoms of abdominal bloating, back pain from esophageal spasm, food not passing down smoothly.  She takes Excedrin 4 pills daily for chronic headaches and arthralgias as well as Tylenol daily.  Takes meloxicam as needed.  She reports trying several different PPIs for esophageal spasm, bloating does not seem to help.  She is on sucralfate currently, denies any constipation or diarrhea.  She does admit to drinking sodas, tea with artificial sweeteners, 4 drinks of alcohol daily, smokes tobacco.  She also states that she quit smoking for 10 years and also quit drinking for 1 year.  She is currently working from home, diagnosed with Graves' disease in January, underwent radioactive iodine treatment which was not successful.  Currently on methimazole.  Planning to undergo radioactive ablation again.  Her alkaline phosphatase and ALT are mildly elevated S/p cholecystectomy With regards to headaches, she attributes her headaches to sinusitis.  She was also seen by neurologist in the past Patient has known history of antral gastric ulcer, duodenitis.  Colonoscopy was unremarkable.  No evidence of H. pylori She currently works from home, has supportive husband, stressed about 2 since her in [employer] She does not feel depressed or anxious Does not exercise, very minimal physical activity  NSAIDs: Excedrin 4 pills daily on average  Antiplts/Anticoagulants/Anti thrombotics: None  GI Procedures: EGD and colonoscopy  in [place] in [place], [place] It was done for epigastric abdominal pain, bloating, change in bowel function.  Postoperative diagnosis was GERD, distal gastritis, gastric ulcer in the gastric antrum near the pylorus, duodenitis, normal-appearing colonic mucosa.  Pathology showed no significant pathology in the small bowel biopsies.  Antral biopsies were normal as were likely with no H. pylori.  Distal esophageal biopsies showed reactive changes.  There were no colon biopsies done.   This colonoscopy report does not mention any abnormalities in the colon or at the appendiceal orifice.  Past Medical History:  Diagnosis Date   Depression    Femoroacetabular impingement of both hips    History of duodenal ulcer    Hyperlipidemia    Osteoarthritis    Thyroid disease     Past Surgical History:  Procedure Laterality Date   APPENDECTOMY     CHOLECYSTECTOMY     COLON SURGERY     removed tumor from colon   COLONOSCOPY     HIP SURGERY     cartiledge repair, shaving of the bone.   ROTATOR CUFF REPAIR     SPINAL FUSION     UPPER GASTROINTESTINAL ENDOSCOPY       Current Outpatient Medications:    ALPRAZolam (XANAX) 0.25 MG tablet, TAKE HALF TO ONE TAB 2 X DAY as needed, Disp: 30 tablet, Rfl: 1   aspirin-acetaminophen-caffeine (EXCEDRIN MIGRAINE) 250-250-65 MG tablet, Take 2 tablets by mouth every 6 (six) hours as needed for headache., Disp: , Rfl:    buPROPion (WELLBUTRIN XL) 150 MG 24 hr tablet, Take 1 tablet (150 mg total) by mouth daily., Disp:  90 tablet, Rfl: 1   FLUoxetine (PROZAC) 10 MG capsule, TAKE 1 CAPSULE(10 MG) BY MOUTH DAILY, Disp: 90 capsule, Rfl: 1   furosemide (LASIX) 20 MG tablet, Take 1 tablet (20 mg total) by mouth daily., Disp: 90 tablet, Rfl: 1   meloxicam (MOBIC) 15 MG tablet, Take 1 tablet (15 mg total) by mouth daily., Disp: 30 tablet, Rfl: 3   methimazole (TAPAZOLE) 10 MG tablet, Take 10 mg by mouth daily., Disp: , Rfl:    ondansetron (ZOFRAN) 4 MG tablet, Take 1  tablet (4 mg total) by mouth every 8 (eight) hours as needed for nausea or vomiting., Disp: 20 tablet, Rfl: 0   potassium chloride SA (KLOR-CON M) 20 MEQ tablet, Take 1 tablet (20 mEq total) by mouth daily as needed (for leg swelling)., Disp: 30 tablet, Rfl: 3   propranolol ER (INDERAL LA) 60 MG 24 hr capsule, Take 1 capsule (60 mg total) by mouth daily., Disp: 30 capsule, Rfl: 11   sucralfate (CARAFATE) 1 g tablet, Take 1 tablet (1 g total) by mouth daily as needed. TAKE 1 TABLET(1 GRAM) BY MOUTH DAILY AS NEEDED FOR NAUSEA OR ABDOMINAL PAIN, Disp: 90 tablet, Rfl: 0   Family History  Problem Relation Age of Onset   Neuropathy Mother    Hypertension Mother    Heart disease Father    Kidney disease Father    Hypertension Father    Heart attack Father    COPD Father    Hypertension Brother    Heart disease Brother    Heart disease Brother    Hypertension Brother    Heart disease Brother    Hypertension Brother      Social History   Tobacco Use   Smoking status: Every Day    Current packs/day: 1.00    Average packs/day: 1 pack/day for 30.0 years (30.0 ttl pk-yrs)    Types: Cigarettes   Smokeless tobacco: Never   Tobacco comments:    Smokes 1/2 pack daily  Vaping Use   Vaping status: Never Used  Substance Use Topics   Alcohol use: Yes    Alcohol/week: 28.0 standard drinks of alcohol    Types: 28 Cans of beer per week    Comment: States she has 3 to 4 white claw per night   Drug use: Never    Allergies as of 11/11/2022   (No Known Allergies)    Review of Systems:    All systems reviewed and negative except where noted in HPI.   Physical Exam:  BP (!) 155/92 (BP Location: Right Arm, Patient Position: Sitting, Cuff Size: Normal)   Pulse 76   Temp 98.1 F (36.7 C) (Oral)   Ht 5\' 4"  (1.626 m)   Wt 167 lb (75.8 kg)   BMI 28.67 kg/m  No LMP recorded. Patient is postmenopausal.  General:   Alert,  Well-developed, well-nourished, pleasant and cooperative in NAD Head:   Normocephalic and atraumatic. Eyes:  Sclera clear, no icterus.   Conjunctiva pink. Ears:  Normal auditory acuity. Nose:  No deformity, discharge, or lesions. Mouth:  No deformity or lesions,oropharynx pink & moist. Neck:  Supple; no masses or thyromegaly. Lungs:  Respirations even and unlabored.  Clear throughout to auscultation.   No wheezes, crackles, or rhonchi. No acute distress. Heart:  Regular rate and rhythm; no murmurs, clicks, rubs, or gallops. Abdomen:  Normal bowel sounds. Soft, non-tender and non-distended without masses, hepatosplenomegaly or hernias noted.  No guarding or rebound tenderness.   Rectal: Not performed  Msk:  Symmetrical without gross deformities. Good, equal movement & strength bilaterally. Pulses:  Normal pulses noted. Extremities:  No clubbing or edema.  No cyanosis. Neurologic:  Alert and oriented x3;  grossly normal neurologically. Skin:  Intact without significant lesions or rashes. No jaundice. Psych:  Alert and cooperative. Normal mood and affect.  Imaging Studies: Reviewed  Assessment and Plan:   Deanna Howard is a 57 y.o. female with history of Graves' disease on methimazole, history of peptic ulcer disease, is seen in consultation for chronic symptoms of abdominal bloating, esophageal spasm, food getting stuck in the throat, history of chronic tobacco, alcohol use as well as artificial sweeteners, carbonated beverages Abdominal bloating is most likely secondary to high sugar intake as well as artificial sweeteners Discussed with patient regarding management of abdominal bloating, advised to completely eliminate sugary drinks, artificial sweeteners Recommend EGD given her history of peptic ulcer disease and dysphagia Advised patient to cut back on taking Excedrin.  Discussed with her regarding long-term NSAID induced damage to the GI tract Strongly reiterated to the patient to incorporate physical activity, exercise daily, cut back on smoking and alcohol  use  Elevated ALT and alkaline phosphatase S/p cholecystectomy Discuss further workup during next follow-up appointment   Follow up in 3 months with PA-C, Lavonna Monarch, MD

## 2022-11-13 ENCOUNTER — Other Ambulatory Visit: Payer: Self-pay | Admitting: Physician Assistant

## 2022-11-13 DIAGNOSIS — M7989 Other specified soft tissue disorders: Secondary | ICD-10-CM

## 2022-11-13 DIAGNOSIS — F17219 Nicotine dependence, cigarettes, with unspecified nicotine-induced disorders: Secondary | ICD-10-CM

## 2022-11-13 DIAGNOSIS — D4989 Neoplasm of unspecified behavior of other specified sites: Secondary | ICD-10-CM

## 2022-12-03 ENCOUNTER — Encounter
Admission: RE | Admit: 2022-12-03 | Discharge: 2022-12-03 | Disposition: A | Discharge status: Home / Self Care | Payer: 59 | Source: Ambulatory Visit | Attending: Internal Medicine | Admitting: Internal Medicine

## 2022-12-03 DIAGNOSIS — E0500 Thyrotoxicosis with diffuse goiter without thyrotoxic crisis or storm: Secondary | ICD-10-CM | POA: Diagnosis present

## 2022-12-03 DIAGNOSIS — E042 Nontoxic multinodular goiter: Secondary | ICD-10-CM | POA: Insufficient documentation

## 2022-12-03 MED ORDER — SODIUM IODIDE I-123 7.4 MBQ CAPS
465.3000 | ORAL_CAPSULE | Freq: Once | ORAL | Status: AC
  Administered 2022-12-03: 465.3 via ORAL

## 2022-12-04 ENCOUNTER — Encounter
Admission: RE | Admit: 2022-12-04 | Discharge: 2022-12-04 | Disposition: A | Discharge status: Home / Self Care | Payer: 59 | Source: Ambulatory Visit | Attending: Internal Medicine | Admitting: Internal Medicine

## 2022-12-07 NOTE — Written Directive (Cosign Needed)
MOLECULAR IMAGING AND THERAPEUTICS WRITTEN DIRECTIVE   PATIENT NAME: Deanna Howard  PT DOB:   1965-09-03                                              MRN: [id number]  ---------------------------------------------------------------------------------------------------------------------   I-131 WHOLE THYROID THERAPY (NON-CANCER)    RADIOPHARMACEUTICAL:   Iodine-131 Capsule                                            Appt:  Friday   12/11/2022   PRESCRIBED DOSE FOR ADMINISTRATION:   29 mCi I-131 NaI   ROUTE OFADMINISTRATION: PO   DIAGNOSIS:  Graves Disease     REFERRING PHYSICIAN:  Carlena Sax MD   TSH:    Lab Results  Component Value Date   TSH <0.005 (L) 06/19/2021   TSH 1.000 07/16/2020  TSH         10/12/22        0.010   PRIOR I-131 THERAPY (Date and Dose):  04/23/2022   13.2 mCi   PRIOR RADIOLOGY EXAMS (Results and Date): NM THYROID MULT UPTAKE W/IMAGING  Result Date: 12/07/2022 CLINICAL DATA:  Graves disease.  Multinodular goiter. EXAM: THYROID SCAN AND UPTAKE - 4 AND 24 HOURS TECHNIQUE: Following oral administration of I-123 capsule, anterior planar imaging was acquired at 24 hours. Thyroid uptake was calculated with a thyroid probe at 4-6 hours and 24 hours. RADIOPHARMACEUTICALS:  465.3 uCi I-123 sodium iodide p.o. COMPARISON:  04/10/2022 FINDINGS: There is diffuse increased radiotracer uptake throughout both lobes of the thyroid gland as well as the peer middle lobe. No focal hot or cold nodule identified. 4 hour I-123 uptake = 58.9% (normal 5-20%), previously 85.7 % 24 hour I-123 uptake = 33.6% (normal 10-30%), previously 76.3% IMPRESSION: 1. As noted previously findings are compatible with the reported history of Graves disease. 2. Persistent but improved 4 hour and 24 hour elevated radioactive iodine uptake. Electronically Signed   By: Signa Kell M.D.   On: 12/07/2022 13:02   NM THYROID MULT UPTAKE W/IMAGING  Result Date: 04/10/2022 CLINICAL DATA:  Graves  disease.  Hyperthyroidism. EXAM: THYROID SCAN AND UPTAKE - 4 AND 24 HOURS TECHNIQUE: Following oral administration of I-123 capsule, anterior planar imaging was acquired at 24 hours. Thyroid uptake was calculated with a thyroid probe at 4-6 hours and 24 hours. RADIOPHARMACEUTICALS:  324.56 uCi I-123 sodium iodide p.o. COMPARISON:  None Available. FINDINGS: Homogeneous uptake is identified throughout the thyroid with no nodules or masses. A pyramidal lobe is incidentally identified. 4 hour I-123 uptake = 85.7% (normal 5-20%) 24 hour I-123 uptake = 76.3% (normal 10-30%) IMPRESSION: The findings are consistent with the reported Graves disease. Markedly elevated iodine uptake is identified in the thyroid at 4 and 24 hours. Electronically Signed   By: Gerome Sam III M.D.   On: 04/10/2022 10:15      ADDITIONAL PHYSICIAN COMMENTS/NOTES   AUTHORIZED USER SIGNATURE & TIME STAMP:

## 2022-12-11 ENCOUNTER — Encounter
Admission: RE | Admit: 2022-12-11 | Discharge: 2022-12-11 | Disposition: A | Discharge status: Home / Self Care | Payer: 59 | Source: Ambulatory Visit | Attending: Internal Medicine | Admitting: Internal Medicine

## 2022-12-11 DIAGNOSIS — E042 Nontoxic multinodular goiter: Secondary | ICD-10-CM | POA: Insufficient documentation

## 2022-12-11 DIAGNOSIS — E0500 Thyrotoxicosis with diffuse goiter without thyrotoxic crisis or storm: Secondary | ICD-10-CM | POA: Diagnosis present

## 2022-12-11 MED ORDER — SODIUM IODIDE I 131 CAPSULE
29.0000 | Freq: Once | INTRAVENOUS | Status: AC
  Administered 2022-12-11: 31.19 via ORAL

## 2022-12-22 ENCOUNTER — Ambulatory Visit
Admission: RE | Admit: 2022-12-22 | Discharge: 2022-12-22 | Disposition: A | Discharge status: Home / Self Care | Payer: 59 | Source: Ambulatory Visit | Attending: Physician Assistant | Admitting: Physician Assistant

## 2022-12-22 DIAGNOSIS — Z1231 Encounter for screening mammogram for malignant neoplasm of breast: Secondary | ICD-10-CM | POA: Diagnosis present

## 2022-12-28 ENCOUNTER — Encounter: Payer: Self-pay | Admitting: Physician Assistant

## 2022-12-28 ENCOUNTER — Telehealth: Payer: Self-pay

## 2022-12-28 ENCOUNTER — Other Ambulatory Visit: Payer: Self-pay

## 2022-12-28 DIAGNOSIS — R1310 Dysphagia, unspecified: Secondary | ICD-10-CM

## 2022-12-28 NOTE — Telephone Encounter (Signed)
Patient was schedule to Have EGD done with Dr Allegra Lai on 12/29/2022 but Dr. Allegra Lai was not going to be there that day. Did not call patient because she was already taken off the schedule so did not know she was on the schedule. She called ENDO today asking for a time of her procedure. Called patient and explained what happened and told her that Dr. Servando Snare could do the procedure tomorrow. She states okay what time does she need to be there. Called endo and Trish states Arrive at 10:00am and check in at registration desk. Informed patient and she verbalized understanding

## 2022-12-29 ENCOUNTER — Ambulatory Visit: Admit: 2022-12-29 | Payer: 59 | Admitting: Gastroenterology

## 2022-12-29 ENCOUNTER — Other Ambulatory Visit: Payer: Self-pay | Admitting: Physician Assistant

## 2022-12-29 ENCOUNTER — Encounter: Payer: Self-pay | Admitting: Gastroenterology

## 2022-12-29 ENCOUNTER — Ambulatory Visit: Payer: 59 | Admitting: Certified Registered"

## 2022-12-29 ENCOUNTER — Encounter
Admission: RE | Disposition: A | Discharge status: Home / Self Care | Payer: Self-pay | Source: Home / Self Care | Attending: Gastroenterology

## 2022-12-29 ENCOUNTER — Ambulatory Visit
Admission: RE | Admit: 2022-12-29 | Discharge: 2022-12-29 | Disposition: A | Discharge status: Home / Self Care | Payer: 59 | Attending: Gastroenterology | Admitting: Gastroenterology

## 2022-12-29 DIAGNOSIS — E0500 Thyrotoxicosis with diffuse goiter without thyrotoxic crisis or storm: Secondary | ICD-10-CM

## 2022-12-29 DIAGNOSIS — K2970 Gastritis, unspecified, without bleeding: Secondary | ICD-10-CM | POA: Insufficient documentation

## 2022-12-29 DIAGNOSIS — K269 Duodenal ulcer, unspecified as acute or chronic, without hemorrhage or perforation: Secondary | ICD-10-CM | POA: Diagnosis not present

## 2022-12-29 DIAGNOSIS — K2980 Duodenitis without bleeding: Secondary | ICD-10-CM

## 2022-12-29 DIAGNOSIS — H538 Other visual disturbances: Secondary | ICD-10-CM

## 2022-12-29 DIAGNOSIS — F17210 Nicotine dependence, cigarettes, uncomplicated: Secondary | ICD-10-CM | POA: Diagnosis not present

## 2022-12-29 DIAGNOSIS — Z0100 Encounter for examination of eyes and vision without abnormal findings: Secondary | ICD-10-CM

## 2022-12-29 DIAGNOSIS — R1310 Dysphagia, unspecified: Secondary | ICD-10-CM | POA: Diagnosis not present

## 2022-12-29 HISTORY — PX: BIOPSY: SHX5522

## 2022-12-29 HISTORY — PX: ESOPHAGOGASTRODUODENOSCOPY (EGD) WITH PROPOFOL: SHX5813

## 2022-12-29 HISTORY — PX: BALLOON DILATION: SHX5330

## 2022-12-29 HISTORY — DX: Thyrotoxicosis with diffuse goiter without thyrotoxic crisis or storm: E05.00

## 2022-12-29 SURGERY — ESOPHAGOGASTRODUODENOSCOPY (EGD) WITH PROPOFOL
Anesthesia: General | Site: Esophagus

## 2022-12-29 SURGERY — ESOPHAGOGASTRODUODENOSCOPY (EGD) WITH PROPOFOL
Anesthesia: General

## 2022-12-29 MED ORDER — PROPOFOL 10 MG/ML IV BOLUS
INTRAVENOUS | Status: DC | PRN
  Administered 2022-12-29 (×3): 50 mg via INTRAVENOUS
  Administered 2022-12-29: 100 mg via INTRAVENOUS

## 2022-12-29 MED ORDER — SODIUM CHLORIDE 0.9 % IV SOLN
INTRAVENOUS | Status: DC
  Administered 2022-12-29: 1000 mL via INTRAVENOUS

## 2022-12-29 MED ORDER — LIDOCAINE HCL (CARDIAC) PF 100 MG/5ML IV SOSY
PREFILLED_SYRINGE | INTRAVENOUS | Status: DC | PRN
  Administered 2022-12-29: 100 mg via INTRAVENOUS

## 2022-12-29 NOTE — H&P (Signed)
Midge Minium, MD [facility] [address], [address] [place], [place] [address] Phone:[phone] Fax : [phone]  Primary Care Physician:  Carlean Jews, PA-C Primary Gastroenterologist:  Dr. Servando Snare  Pre-Procedure History & Physical: HPI:  Deanna Howard is a 57 y.o. female is here for an endoscopy.   Past Medical History:  Diagnosis Date   Depression    Femoroacetabular impingement of both hips    Graves disease    History of duodenal ulcer    Hyperlipidemia    Osteoarthritis    Thyroid disease     Past Surgical History:  Procedure Laterality Date   APPENDECTOMY     CHOLECYSTECTOMY     COLON SURGERY     removed tumor from colon   COLONOSCOPY     HIP SURGERY     cartiledge repair, shaving of the bone.   ROTATOR CUFF REPAIR     SPINAL FUSION     UPPER GASTROINTESTINAL ENDOSCOPY      Prior to Admission medications   Medication Sig Start Date End Date Taking? Authorizing Provider  ALPRAZolam (XANAX) 0.25 MG tablet TAKE HALF TO ONE TAB 2 X DAY as needed 10/26/22  Yes McDonough, Lauren K, PA-C  buPROPion (WELLBUTRIN XL) 150 MG 24 hr tablet Take 1 tablet (150 mg total) by mouth daily. 05/07/22  Yes McDonough, Lauren K, PA-C  FLUoxetine (PROZAC) 10 MG capsule TAKE 1 CAPSULE(10 MG) BY MOUTH DAILY 05/07/22  Yes McDonough, Lauren K, PA-C  furosemide (LASIX) 20 MG tablet Take 1 tablet (20 mg total) by mouth daily. 10/26/22  Yes McDonough, Lauren K, PA-C  meloxicam (MOBIC) 15 MG tablet Take 1 tablet (15 mg total) by mouth daily. 05/07/22  Yes McDonough, Lauren K, PA-C  methimazole (TAPAZOLE) 10 MG tablet Take 10 mg by mouth daily. 07/29/21  Yes [provider]  ondansetron (ZOFRAN) 4 MG tablet Take 1 tablet (4 mg total) by mouth every 8 (eight) hours as needed for nausea or vomiting. 10/26/22  Yes McDonough, Lauren K, PA-C  potassium chloride SA (KLOR-CON M) 20 MEQ tablet Take 1 tablet (20 mEq total) by mouth daily as needed (for leg swelling). 02/16/22  Yes Gollan, Tollie Pizza, MD   propranolol ER (INDERAL LA) 60 MG 24 hr capsule Take 1 capsule (60 mg total) by mouth daily. 05/07/22 05/07/23 Yes McDonough, Salomon Fick, PA-C  aspirin-acetaminophen-caffeine (EXCEDRIN MIGRAINE) [phone] MG tablet Take 2 tablets by mouth every 6 (six) hours as needed for headache.    [provider]  sucralfate (CARAFATE) 1 g tablet Take 1 tablet (1 g total) by mouth daily as needed. TAKE 1 TABLET(1 GRAM) BY MOUTH DAILY AS NEEDED FOR NAUSEA OR ABDOMINAL PAIN 10/26/22   Carlean Jews, PA-C    Allergies as of 12/28/2022   (No Known Allergies)    Family History  Problem Relation Age of Onset   Neuropathy Mother    Hypertension Mother    Heart disease Father    Kidney disease Father    Hypertension Father    Heart attack Father    COPD Father    Hypertension Brother    Heart disease Brother    Heart disease Brother    Hypertension Brother    Heart disease Brother    Hypertension Brother     Social History   Socioeconomic History   Marital status: Married    Spouse name: Not on file   Number of children: Not on file   Years of education: Not on file  Highest education level: Not on file  Occupational History   Not on file  Tobacco Use   Smoking status: Every Day    Current packs/day: 1.00    Average packs/day: 1 pack/day for 30.0 years (30.0 ttl pk-yrs)    Types: Cigarettes   Smokeless tobacco: Never   Tobacco comments:    Smokes 1/2 pack daily  Vaping Use   Vaping status: Never Used  Substance and Sexual Activity   Alcohol use: Yes    Alcohol/week: 28.0 standard drinks of alcohol    Types: 28 Cans of beer per week    Comment: States she has 3 to 4 white claw per night   Drug use: Never   Sexual activity: Not on file  Other Topics Concern   Not on file  Social History Narrative   Not on file   Social Determinants of Health   Financial Resource Strain: Not on file  Food Insecurity: Not on file  Transportation Needs: Not on file  Physical Activity:  Not on file  Stress: Not on file  Social Connections: Not on file  Intimate Partner Violence: Not on file    Review of Systems: See HPI, otherwise negative ROS  Physical Exam: BP (!) 172/77   Pulse (!) 103   Temp (!) 97 F (36.1 C) (Temporal)   Resp 16   Ht 5\' 3"  (1.6 m)   Wt 73.6 kg   SpO2 99%   BMI 28.75 kg/m  General:   Alert,  pleasant and cooperative in NAD Head:  Normocephalic and atraumatic. Neck:  Supple; no masses or thyromegaly. Lungs:  Clear throughout to auscultation.    Heart:  Regular rate and rhythm. Abdomen:  Soft, nontender and nondistended. Normal bowel sounds, without guarding, and without rebound.   Neurologic:  Alert and  oriented x4;  grossly normal neurologically.  Impression/Plan: Deanna Howard is here for an endoscopy to be performed for dysphagia  Risks, benefits, limitations, and alternatives regarding  endoscopy have been reviewed with the patient.  Questions have been answered.  All parties agreeable.   Midge Minium, MD  12/29/2022, 11:10 AM

## 2022-12-29 NOTE — Anesthesia Preprocedure Evaluation (Signed)
Anesthesia Evaluation  Patient identified by MRN, date of birth, ID band Patient awake    Reviewed: Allergy & Precautions, H&P , NPO status , Patient's Chart, lab work & pertinent test results  Airway Mallampati: II  TM Distance: >3 FB Neck ROM: full    Dental  (+) Upper Dentures   Pulmonary Current Smoker and Patient abstained from smoking.   Pulmonary exam normal        Cardiovascular negative cardio ROS Normal cardiovascular exam     Neuro/Psych negative neurological ROS  negative psych ROS   GI/Hepatic negative GI ROS, Neg liver ROS,,,  Endo/Other  Graves disease   Renal/GU negative Renal ROS  negative genitourinary   Musculoskeletal  (+) Arthritis ,    Abdominal Normal abdominal exam  (+)   Peds  Hematology negative hematology ROS (+)   Anesthesia Other Findings Past Medical History: No date: Depression No date: Femoroacetabular impingement of both hips No date: Graves disease No date: History of duodenal ulcer No date: Hyperlipidemia No date: Osteoarthritis No date: Thyroid disease  Past Surgical History: No date: APPENDECTOMY No date: CHOLECYSTECTOMY No date: COLON SURGERY     Comment:  removed tumor from colon No date: COLONOSCOPY No date: HIP SURGERY     Comment:  cartiledge repair, shaving of the bone. No date: ROTATOR CUFF REPAIR No date: SPINAL FUSION No date: UPPER GASTROINTESTINAL ENDOSCOPY  BMI    Body Mass Index: 28.75 kg/m      Reproductive/Obstetrics negative OB ROS                              Anesthesia Physical Anesthesia Plan  ASA: 2  Anesthesia Plan: General   Post-op Pain Management:    Induction: Intravenous  PONV Risk Score and Plan: Propofol infusion and TIVA  Airway Management Planned: Natural Airway  Additional Equipment:   Intra-op Plan:   Post-operative Plan:   Informed Consent: I have reviewed the patients History and  Physical, chart, labs and discussed the procedure including the risks, benefits and alternatives for the proposed anesthesia with the patient or authorized representative who has indicated his/her understanding and acceptance.     Dental Advisory Given  Plan Discussed with: CRNA and Surgeon  Anesthesia Plan Comments:          Anesthesia Quick Evaluation

## 2022-12-29 NOTE — Transfer of Care (Signed)
Immediate Anesthesia Transfer of Care Note  Patient: Deanna Howard  Procedure(s) Performed: ESOPHAGOGASTRODUODENOSCOPY (EGD) WITH PROPOFOL BIOPSY  Patient Location: Endoscopy Unit  Anesthesia Type:General  Level of Consciousness: awake, alert , and oriented  Airway & Oxygen Therapy: Patient Spontanous Breathing  Post-op Assessment: Report given to RN, Post -op Vital signs reviewed and stable, and Patient moving all extremities  Post vital signs: Reviewed and stable  Last Vitals:  Vitals Value Taken Time  BP 145/75 12/29/22 1128  Temp 36.1 C 12/29/22 1128  Pulse 106 12/29/22 1130  Resp 21 12/29/22 1130  SpO2 97 % 12/29/22 1130  Vitals shown include unfiled device data.  Last Pain:  Vitals:   12/29/22 1128  TempSrc:   PainSc: 0-No pain         Complications: No notable events documented.

## 2022-12-29 NOTE — Op Note (Signed)
[facility] Patient Name: Deanna Howard Procedure Date: 12/29/2022 11:02 AM MRN: [id number] Account #: [id number] Date of Birth: 1965-05-13 Admit Type: Outpatient Age: 57 Room: [facility] ENDO ROOM 4 Gender: Female Note Status: Finalized Instrument Name: Upper Endoscope [id number] Procedure:             Upper GI endoscopy Indications:           Dysphagia Providers:             Midge Minium MD, MD Referring MD:          Carlean Jews (Referring MD) Medicines:             Propofol per Anesthesia Complications:         No immediate complications. Procedure:             Pre-Anesthesia Assessment:                        - Prior to the procedure, a History and Physical was                         performed, and patient medications and allergies were                         reviewed. The patient's tolerance of previous                         anesthesia was also reviewed. The risks and benefits                         of the procedure and the sedation options and risks                         were discussed with the patient. All questions were                         answered, and informed consent was obtained. Prior                         Anticoagulants: The patient has taken no anticoagulant                         or antiplatelet agents. ASA Grade Assessment: II - A                         patient with mild systemic disease. After reviewing                         the risks and benefits, the patient was deemed in                         satisfactory condition to undergo the procedure.                        After obtaining informed consent, the endoscope was                         passed under direct vision. Throughout the procedure,  the patient's blood pressure, pulse, and oxygen                         saturations were monitored continuously. The Endoscope                         was introduced through the mouth, and advanced to  the                         second part of duodenum. The upper GI endoscopy was                         accomplished without difficulty. The patient tolerated                         the procedure well. Findings:      The examined esophagus was normal. A TTS dilator was passed through the       scope. Dilation with a 15-16.5-18 mm balloon dilator was performed to 18       mm. The dilation site was examined following endoscope reinsertion and       showed no change. Random biopsies were obtained in the middle third of       the esophagus with cold forceps for histology.      Localized moderate inflammation characterized by erythema was found in       the gastric antrum. Biopsies were taken with a cold forceps for       histology.      Localized moderate inflammation characterized by erosions was found in       the duodenal bulb. Biopsies were taken with a cold forceps for histology. Impression:            - Normal esophagus. Dilated.                        - Gastritis. Biopsied.                        - Duodenitis. Biopsied.                        - Random biopsies were obtained in the middle third of                         the esophagus. Recommendation:        - Discharge patient to home.                        - Resume previous diet.                        - Continue present medications.                        - Await pathology results.                        - No aspirin, ibuprofen, naproxen, or other                         non-steroidal anti-inflammatory drugs. Procedure Code(s):     --- Professional ---  [phone], Esophagogastroduodenoscopy, flexible,                         transoral; with transendoscopic balloon dilation of                         esophagus (less than 30 mm diameter)                        [address]39, 59, Esophagogastroduodenoscopy, flexible,                         transoral; with biopsy, single or multiple Diagnosis Code(s):     ---  Professional ---                        R13.10, Dysphagia, unspecified                        K29.80, Duodenitis without bleeding                        K29.70, Gastritis, unspecified, without bleeding CPT copyright 2022 American Medical Association. All rights reserved. The codes documented in this report are preliminary and upon coder review may  be revised to meet current compliance requirements. Midge Minium MD, MD 12/29/2022 11:29:46 AM This report has been signed electronically. Number of Addenda: 0 Note Initiated On: 12/29/2022 11:02 AM Estimated Blood Loss:  Estimated blood loss: none.      [facility]

## 2022-12-30 ENCOUNTER — Telehealth: Payer: Self-pay | Admitting: Physician Assistant

## 2022-12-30 ENCOUNTER — Encounter: Payer: Self-pay | Admitting: Gastroenterology

## 2022-12-30 LAB — SURGICAL PATHOLOGY

## 2022-12-30 NOTE — Telephone Encounter (Signed)
Ophthalmology referral sent via [employer] to [facility]. Notified patient. Gave pt telephone # [phone]

## 2022-12-30 NOTE — Anesthesia Postprocedure Evaluation (Signed)
Anesthesia Post Note  Patient: Deanna Howard  Procedure(s) Performed: ESOPHAGOGASTRODUODENOSCOPY (EGD) WITH PROPOFOL BIOPSY BALLOON DILATION (Esophagus)  Patient location during evaluation: Endoscopy Anesthesia Type: General Level of consciousness: awake and alert Pain management: pain level controlled Vital Signs Assessment: post-procedure vital signs reviewed and stable Respiratory status: spontaneous breathing, nonlabored ventilation and respiratory function stable Cardiovascular status: blood pressure returned to baseline and stable Postop Assessment: no apparent nausea or vomiting Anesthetic complications: no   No notable events documented.   Last Vitals:  Vitals:   12/29/22 1027 12/29/22 1128  BP: (!) 172/77   Pulse: (!) 103   Resp: 16   Temp: (!) 36.1 C (!) 36.1 C  SpO2: 99%     Last Pain:  Vitals:   12/29/22 1148  TempSrc:   PainSc: 0-No pain                 Foye Deer

## 2022-12-31 ENCOUNTER — Other Ambulatory Visit: Payer: Self-pay

## 2022-12-31 MED ORDER — PANTOPRAZOLE SODIUM 40 MG PO TBEC: 40.0000 mg | DELAYED_RELEASE_TABLET | Freq: Every day | ORAL | 3 refills | Status: DC

## 2023-01-08 ENCOUNTER — Telehealth: Payer: Self-pay | Admitting: Physician Assistant

## 2023-01-08 NOTE — Telephone Encounter (Signed)
Ophthalmology appointment 01/27/2023 @ [facility]

## 2023-01-13 ENCOUNTER — Telehealth: Payer: Self-pay | Admitting: Physician Assistant

## 2023-01-13 NOTE — Telephone Encounter (Signed)
Ophthalmology appointment 01/27/2023 @ [facility]

## 2023-01-16 NOTE — Progress Notes (Unsigned)
Cardiology Office Note  Date:  01/18/2023   ID:  Deanna Howard, DOB 08-01-1965, MRN [id number]  PCP:  Carlean Jews, PA-C   Chief Complaint  Patient presents with   12 month follow up     Patient c/o shortness of breath with activity. Medications reviewed by the patient verbally.     HPI:  Deanna Howard is a 57 y.o. female with history of  hyperlipidemia,  thyroid disease, nodule 2.5 cm cigarette smoking , 1 ppd Presenting to the emergency room 2023 with shortness of breath.  Who presents for follow-up of her shortness of breath, tachypalpitations, hypothyroidism Stop spending Last seen in clinic by myself November 2023  Iodine rx Jan 2024 and redo sept  2024 Feels better, less tachypalpitations Off methimazole Taking propranolol 20 BID, off 60 mg ER  No regular exercise program Plays golf once a week down the street from where she lives Works from home, [employer] Recent stress, had to put her dog down this past week  Denies significant chest pain Has chronic mild shortness of breath on exertion No significant lower extremity edema  Reports still smoking 1 pack/day Quit for 10 years Restarted 02/2018  EKG personally reviewed by myself on todays visit EKG Interpretation Date/Time:  Monday January 18 2023 09:05:23 EDT Ventricular Rate:  87 PR Interval:  140 QRS Duration:  88 QT Interval:  362 QTC Calculation: 435 R Axis:   53  Text Interpretation: Normal sinus rhythm Nonspecific ST abnormality When compared with ECG of 07-Jul-2021 07:09, No significant change was found Confirmed by Julien Nordmann [phone]) on 01/18/2023 9:22:26 AM   Other past medical history  leg swelling, shortness of breath starting February 2023 emergency room April 2023, blood pressure elevated, started on Lasix twice a day Felt to have pulmonary edema, high fluid and salt intake, started on Lasix twice a day with potassium On that visit was changed from metoprolol to tartrate to  propranolol ER 60 daily for hyperthyroid  Echocardiogram ordered at that time performed July 23, 2021 Normal LV function, no significant valvular heart disease  CT scan images 2.5 cm thyroid nodule noted Minimal aortic atherosclerosis Small pleural effusions, pulmonary edema   PMH:   has a past medical history of Depression, Femoroacetabular impingement of both hips, Graves disease, History of duodenal ulcer, Hyperlipidemia, Osteoarthritis, and Thyroid disease.  PSH:    Past Surgical History:  Procedure Laterality Date   APPENDECTOMY     BALLOON DILATION  12/29/2022   Procedure: BALLOON DILATION;  Surgeon: Midge Minium, MD;  Location: [facility] ENDOSCOPY;  Service: Endoscopy;;   BIOPSY  12/29/2022   Procedure: BIOPSY;  Surgeon: Midge Minium, MD;  Location: [facility] ENDOSCOPY;  Service: Endoscopy;;   CHOLECYSTECTOMY     COLON SURGERY     removed tumor from colon   COLONOSCOPY     ESOPHAGOGASTRODUODENOSCOPY (EGD) WITH PROPOFOL N/A 12/29/2022   Procedure: ESOPHAGOGASTRODUODENOSCOPY (EGD) WITH PROPOFOL;  Surgeon: Midge Minium, MD;  Location: [facility] ENDOSCOPY;  Service: Endoscopy;  Laterality: N/A;   HIP SURGERY     cartiledge repair, shaving of the bone.   ROTATOR CUFF REPAIR     SPINAL FUSION     UPPER GASTROINTESTINAL ENDOSCOPY      Current Outpatient Medications  Medication Sig Dispense Refill   ALPRAZolam (XANAX) 0.25 MG tablet TAKE HALF TO ONE TAB 2 X DAY as needed 30 tablet 1   aspirin-acetaminophen-caffeine (EXCEDRIN MIGRAINE) 250-250-65 MG tablet Take 2 tablets by mouth every 6 (six) hours as needed  for headache.     buPROPion (WELLBUTRIN XL) 150 MG 24 hr tablet Take 1 tablet (150 mg total) by mouth daily. 90 tablet 1   FLUoxetine (PROZAC) 10 MG capsule TAKE 1 CAPSULE(10 MG) BY MOUTH DAILY 90 capsule 1   furosemide (LASIX) 20 MG tablet Take 1 tablet (20 mg total) by mouth daily. 90 tablet 1   meloxicam (MOBIC) 15 MG tablet Take 1 tablet (15 mg total) by mouth daily. 30 tablet 3    ondansetron (ZOFRAN) 4 MG tablet Take 1 tablet (4 mg total) by mouth every 8 (eight) hours as needed for nausea or vomiting. 20 tablet 0   pantoprazole (PROTONIX) 40 MG tablet Take 1 tablet (40 mg total) by mouth daily. 90 tablet 3   potassium chloride SA (KLOR-CON M) 20 MEQ tablet Take 1 tablet (20 mEq total) by mouth daily as needed (for leg swelling). 30 tablet 3   propranolol (INDERAL) 20 MG tablet Take 20 mg by mouth 2 (two) times daily.     sucralfate (CARAFATE) 1 g tablet Take 1 tablet (1 g total) by mouth daily as needed. TAKE 1 TABLET(1 GRAM) BY MOUTH DAILY AS NEEDED FOR NAUSEA OR ABDOMINAL PAIN 90 tablet 0   methimazole (TAPAZOLE) 10 MG tablet Take 10 mg by mouth daily. (Patient not taking: Reported on 01/18/2023)     No current facility-administered medications for this visit.    Allergies:   Patient has no known allergies.   Social History:  The patient  reports that she has been smoking cigarettes. She has a 30 pack-year smoking history. She has never used smokeless tobacco. She reports current alcohol use of about 28.0 standard drinks of alcohol per week. She reports that she does not use drugs.   Family History:   family history includes COPD in her father; Heart attack in her father; Heart disease in her brother, brother, brother, and father; Hypertension in her brother, brother, brother, father, and mother; Kidney disease in her father; Neuropathy in her mother.    Review of Systems: Review of Systems  Constitutional: Negative.   HENT: Negative.    Respiratory: Negative.    Cardiovascular: Negative.   Gastrointestinal: Negative.   Musculoskeletal: Negative.   Neurological: Negative.   Psychiatric/Behavioral: Negative.    All other systems reviewed and are negative.   PHYSICAL EXAM: VS:  BP (!) 140/60 (BP Location: Left Arm, Patient Position: Sitting, Cuff Size: Normal)   Pulse 87   Ht 5\' 4"  (1.626 m)   Wt 165 lb 8 oz (75.1 kg)   SpO2 97%   BMI 28.41 kg/m  , BMI  Body mass index is 28.41 kg/m. Constitutional:  oriented to person, place, and time. No distress.  HENT:  Head: Grossly normal Eyes:  no discharge. No scleral icterus.  Neck: No JVD, no carotid bruits  Cardiovascular: Regular rate and rhythm, no murmurs appreciated Pulmonary/Chest: Clear to auscultation bilaterally, no wheezes or rails Abdominal: Soft.  no distension.  no tenderness.  Musculoskeletal: Normal range of motion Neurological:  normal muscle tone. Coordination normal. No atrophy Skin: Skin warm and dry Psychiatric: normal affect, pleasant  Recent Labs: 10/27/2022: ALT 34; BUN 12; Creatinine, Ser 0.55; Hemoglobin 13.8; Platelets 213; Potassium 4.1; Sodium 141    Lipid Panel Lab Results  Component Value Date   CHOL 188 10/27/2022   HDL 60 10/27/2022   LDLCALC 108 (H) 10/27/2022   TRIG 113 10/27/2022      Wt Readings from Last 3 Encounters:  01/18/23 165  lb 8 oz (75.1 kg)  12/29/22 162 lb 5.2 oz (73.6 kg)  11/11/22 167 lb (75.8 kg)     ASSESSMENT AND PLAN:  Problem List Items Addressed This Visit   None Visit Diagnoses     Chronic diastolic CHF (congestive heart failure) (HCC)    -  Primary   Relevant Medications   propranolol (INDERAL) 20 MG tablet   Other Relevant Orders   EKG 12-Lead (Completed)   Aortic atherosclerosis (HCC)       Relevant Medications   propranolol (INDERAL) 20 MG tablet   Other Relevant Orders   EKG 12-Lead (Completed)   Smoker       Essential hypertension       Relevant Medications   propranolol (INDERAL) 20 MG tablet   Other Relevant Orders   EKG 12-Lead (Completed)   Bilateral carotid bruits       Congestive heart failure, unspecified HF chronicity, unspecified heart failure type (HCC)       Relevant Medications   propranolol (INDERAL) 20 MG tablet   Other Relevant Orders   EKG 12-Lead (Completed)   Hyperthyroidism       Relevant Medications   propranolol (INDERAL) 20 MG tablet   Other Relevant Orders   EKG 12-Lead  (Completed)      Acute diastolic CHF Appears euvolemic, on Lasix 20 daily Propranolol 20 twice daily for rate control Has undergone thyroid iodine ablation x 2, repeat lab work pending  Hyperthyroidism Followed by endocrine Off methimazole,  propranolol ER 60 daily changed to propranolol 20 twice daily Overall following ablation reports she is feeling better  Essential hypertension Continue propranolol 20 twice daily For further weaning of the propranolol as thyroid disease gets better, recommend she monitor blood pressure and call us for systolics in the 140 range  Smoker Still smoking 1 pack/day, previously quit with Wellbutrin Cessation recommended, declining Chantix  Carotid bruit Bruit with prior workup including ultrasound showing no significant disease   Signed, Dossie Arbour, M.D., Ph.D. [facility] [place], [place] [phone]

## 2023-01-18 ENCOUNTER — Ambulatory Visit: Payer: 59 | Attending: Cardiovascular Disease | Admitting: Cardiovascular Disease

## 2023-01-18 ENCOUNTER — Encounter: Payer: Self-pay | Admitting: Cardiovascular Disease

## 2023-01-18 VITALS — BP 140/60 | HR 87 | Ht 64.0 in | Wt 165.5 lb

## 2023-01-18 DIAGNOSIS — I509 Heart failure, unspecified: Secondary | ICD-10-CM

## 2023-01-18 DIAGNOSIS — I5032 Chronic diastolic (congestive) heart failure: Secondary | ICD-10-CM

## 2023-01-18 DIAGNOSIS — I700 Atherosclerosis of aorta: Secondary | ICD-10-CM | POA: Diagnosis not present

## 2023-01-18 DIAGNOSIS — F17200 Nicotine dependence, unspecified, uncomplicated: Secondary | ICD-10-CM

## 2023-01-18 DIAGNOSIS — I10 Essential (primary) hypertension: Secondary | ICD-10-CM | POA: Diagnosis not present

## 2023-01-18 DIAGNOSIS — E0590 Thyrotoxicosis, unspecified without thyrotoxic crisis or storm: Secondary | ICD-10-CM

## 2023-01-18 DIAGNOSIS — R0989 Other specified symptoms and signs involving the circulatory and respiratory systems: Secondary | ICD-10-CM

## 2023-01-18 NOTE — Patient Instructions (Signed)

## 2023-01-25 ENCOUNTER — Ambulatory Visit (INDEPENDENT_AMBULATORY_CARE_PROVIDER_SITE_OTHER): Payer: 59 | Admitting: Physician Assistant

## 2023-01-25 ENCOUNTER — Encounter: Payer: Self-pay | Admitting: Physician Assistant

## 2023-01-25 VITALS — BP 138/80 | HR 88 | Temp 97.8°F | Resp 16 | Ht 64.0 in | Wt 167.0 lb

## 2023-01-25 DIAGNOSIS — F17219 Nicotine dependence, cigarettes, with unspecified nicotine-induced disorders: Secondary | ICD-10-CM

## 2023-01-25 DIAGNOSIS — I10 Essential (primary) hypertension: Secondary | ICD-10-CM

## 2023-01-25 DIAGNOSIS — E0500 Thyrotoxicosis with diffuse goiter without thyrotoxic crisis or storm: Secondary | ICD-10-CM

## 2023-01-25 DIAGNOSIS — F411 Generalized anxiety disorder: Secondary | ICD-10-CM

## 2023-01-25 DIAGNOSIS — D4989 Neoplasm of unspecified behavior of other specified sites: Secondary | ICD-10-CM

## 2023-01-25 MED ORDER — FLUOXETINE HCL 10 MG PO CAPS
ORAL_CAPSULE | ORAL | 1 refills | Status: DC
Start: 2023-01-25 — End: 2023-08-13

## 2023-01-25 MED ORDER — FUROSEMIDE 20 MG PO TABS: 20.0000 mg | ORAL_TABLET | Freq: Every day | ORAL | 1 refills | Status: DC

## 2023-01-25 NOTE — Progress Notes (Signed)
[facility] [address] [place], [place] [address]  Internal MEDICINE  Office Visit Note  Patient Name: Deanna Howard  [id number]  [id number]  Date of Service: 01/25/2023  Chief Complaint  Patient presents with   Follow-up   Hyperlipidemia   Depression    HPI Pt is here for routine follow up -Did have EGD and was started on med for gastritis, taking pantoprazole -repeat RAI done on thyroid in Sept, had follow up last week with endo. More energy since this. -Smoking currently at 1/2-1ppd, smoking about 30 years. Does need Ct chest. Previously denied in favor of MRI for follow up on possible thymoma, however patient requires sedation for MRI and would like to retry for CT approval instead -BP stable  Current Medication: Outpatient Encounter Medications as of 01/25/2023  Medication Sig Note   ALPRAZolam (XANAX) 0.25 MG tablet TAKE HALF TO ONE TAB 2 X DAY as needed    aspirin-acetaminophen-caffeine (EXCEDRIN MIGRAINE) 250-250-65 MG tablet Take 2 tablets by mouth every 6 (six) hours as needed for headache.    buPROPion (WELLBUTRIN XL) 150 MG 24 hr tablet Take 1 tablet (150 mg total) by mouth daily.    meloxicam (MOBIC) 15 MG tablet Take 1 tablet (15 mg total) by mouth daily.    methimazole (TAPAZOLE) 10 MG tablet Take 10 mg by mouth daily. 11/11/2022: Taking 20mg  daily    ondansetron (ZOFRAN) 4 MG tablet Take 1 tablet (4 mg total) by mouth every 8 (eight) hours as needed for nausea or vomiting.    pantoprazole (PROTONIX) 40 MG tablet Take 1 tablet (40 mg total) by mouth daily.    potassium chloride SA (KLOR-CON M) 20 MEQ tablet Take 1 tablet (20 mEq total) by mouth daily as needed (for leg swelling).    propranolol (INDERAL) 20 MG tablet Take 20 mg by mouth 2 (two) times daily.    sucralfate (CARAFATE) 1 g tablet Take 1 tablet (1 g total) by mouth daily as needed. TAKE 1 TABLET(1 GRAM) BY MOUTH DAILY AS NEEDED FOR NAUSEA OR ABDOMINAL PAIN    [DISCONTINUED] FLUoxetine  (PROZAC) 10 MG capsule TAKE 1 CAPSULE(10 MG) BY MOUTH DAILY    [DISCONTINUED] furosemide (LASIX) 20 MG tablet Take 1 tablet (20 mg total) by mouth daily.    FLUoxetine (PROZAC) 10 MG capsule TAKE 1 CAPSULE(10 MG) BY MOUTH DAILY    furosemide (LASIX) 20 MG tablet Take 1 tablet (20 mg total) by mouth daily.    No facility-administered encounter medications on file as of 01/25/2023.    Surgical History: Past Surgical History:  Procedure Laterality Date   APPENDECTOMY     BALLOON DILATION  12/29/2022   Procedure: BALLOON DILATION;  Surgeon: Midge Minium, MD;  Location: [facility] ENDOSCOPY;  Service: Endoscopy;;   BIOPSY  12/29/2022   Procedure: BIOPSY;  Surgeon: Midge Minium, MD;  Location: [facility] ENDOSCOPY;  Service: Endoscopy;;   CHOLECYSTECTOMY     COLON SURGERY     removed tumor from colon   COLONOSCOPY     ESOPHAGOGASTRODUODENOSCOPY (EGD) WITH PROPOFOL N/A 12/29/2022   Procedure: ESOPHAGOGASTRODUODENOSCOPY (EGD) WITH PROPOFOL;  Surgeon: Midge Minium, MD;  Location: [facility] ENDOSCOPY;  Service: Endoscopy;  Laterality: N/A;   HIP SURGERY     cartiledge repair, shaving of the bone.   ROTATOR CUFF REPAIR     SPINAL FUSION     UPPER GASTROINTESTINAL ENDOSCOPY      Medical History: Past Medical History:  Diagnosis Date   Depression    Femoroacetabular impingement of  both hips    Graves disease    History of duodenal ulcer    Hyperlipidemia    Osteoarthritis    Thyroid disease     Family History: Family History  Problem Relation Age of Onset   Neuropathy Mother    Hypertension Mother    Heart disease Father    Kidney disease Father    Hypertension Father    Heart attack Father    COPD Father    Hypertension Brother    Heart disease Brother    Heart disease Brother    Hypertension Brother    Heart disease Brother    Hypertension Brother     Social History   Socioeconomic History   Marital status: Married    Spouse name: Not on file   Number of children: Not on file    Years of education: Not on file   Highest education level: Not on file  Occupational History   Not on file  Tobacco Use   Smoking status: Every Day    Current packs/day: 1.00    Average packs/day: 1 pack/day for 30.0 years (30.0 ttl pk-yrs)    Types: Cigarettes   Smokeless tobacco: Never   Tobacco comments:    Smokes 1/2 pack daily  Vaping Use   Vaping status: Never Used  Substance and Sexual Activity   Alcohol use: Yes    Alcohol/week: 28.0 standard drinks of alcohol    Types: 28 Cans of beer per week    Comment: States she has 3 to 4 white claw per night   Drug use: Never   Sexual activity: Not on file  Other Topics Concern   Not on file  Social History Narrative   Not on file   Social Determinants of Health   Financial Resource Strain: Not on file  Food Insecurity: Not on file  Transportation Needs: Not on file  Physical Activity: Not on file  Stress: Not on file  Social Connections: Not on file  Intimate Partner Violence: Not on file      Review of Systems  Constitutional:  Negative for chills, fatigue and unexpected weight change.  HENT:  Negative for congestion, postnasal drip, rhinorrhea, sneezing and sore throat.   Eyes:  Negative for redness.  Respiratory:  Negative for cough, chest tightness and shortness of breath.   Cardiovascular:  Negative for chest pain and palpitations.  Gastrointestinal:  Negative for abdominal pain, constipation, diarrhea, nausea and vomiting.  Genitourinary:  Negative for dysuria and frequency.  Musculoskeletal:  Negative for arthralgias, back pain, joint swelling and neck pain.  Skin:  Negative for rash.  Neurological: Negative.  Negative for tremors and numbness.  Hematological:  Negative for adenopathy. Does not bruise/bleed easily.  Psychiatric/Behavioral:  Negative for behavioral problems (Depression), sleep disturbance and suicidal ideas. The patient is not nervous/anxious.     Vital Signs: BP 138/80   Pulse 88   Temp  97.8 F (36.6 C)   Resp 16   Ht 5\' 4"  (1.626 m)   Wt 167 lb (75.8 kg)   SpO2 98%   BMI 28.67 kg/m    Physical Exam Vitals and nursing note reviewed.  Constitutional:      General: She is not in acute distress.    Appearance: Normal appearance. She is well-developed. She is not diaphoretic.  HENT:     Head: Normocephalic and atraumatic.     Mouth/Throat:     Pharynx: No oropharyngeal exudate.  Eyes:     Pupils: Pupils  are equal, round, and reactive to light.  Neck:     Thyroid: No thyromegaly.     Vascular: No JVD.     Trachea: No tracheal deviation.  Cardiovascular:     Rate and Rhythm: Normal rate and regular rhythm.     Heart sounds: Normal heart sounds. No murmur heard.    No friction rub. No gallop.  Pulmonary:     Effort: Pulmonary effort is normal. No respiratory distress.     Breath sounds: No wheezing or rales.  Chest:     Chest wall: No tenderness.  Abdominal:     General: Bowel sounds are normal.     Palpations: Abdomen is soft.  Musculoskeletal:        General: Normal range of motion.     Cervical back: Normal range of motion and neck supple.  Lymphadenopathy:     Cervical: No cervical adenopathy.  Skin:    General: Skin is warm and dry.  Neurological:     Mental Status: She is alert and oriented to person, place, and time.     Cranial Nerves: No cranial nerve deficit.  Psychiatric:        Behavior: Behavior normal.        Thought Content: Thought content normal.        Judgment: Judgment normal.        Assessment/Plan: 1. Benign hypertension Stable, continue current medication  2. GAD (generalized anxiety disorder) - FLUoxetine (PROZAC) 10 MG capsule; TAKE 1 CAPSULE(10 MG) BY MOUTH DAILY  Dispense: 90 capsule; Refill: 1  3. Cigarette nicotine dependence with nicotine-induced disorder Will reorder CT chest - CT Chest Wo Contrast; Future  4. Graves' disease Followed by endo s/p RAI  5. Thymoma - CT Chest Wo Contrast;  Future    General Counseling: aashrita hyles understanding of the findings of todays visit and agrees with plan of treatment. I have discussed any further diagnostic evaluation that may be needed or ordered today. We also reviewed her medications today. she has been encouraged to call the office with any questions or concerns that should arise related to todays visit.    Orders Placed This Encounter  Procedures   CT Chest Wo Contrast    Meds ordered this encounter  Medications   FLUoxetine (PROZAC) 10 MG capsule    Sig: TAKE 1 CAPSULE(10 MG) BY MOUTH DAILY    Dispense:  90 capsule    Refill:  1   furosemide (LASIX) 20 MG tablet    Sig: Take 1 tablet (20 mg total) by mouth daily.    Dispense:  90 tablet    Refill:  1    This patient was seen by Lynn Ito, PA-C in collaboration with Dr. Beverely Risen as a part of collaborative care agreement.   Total time spent:30 Minutes Time spent includes review of chart, medications, test results, and follow up plan with the patient.      Dr Lyndon Code Internal medicine

## 2023-01-28 ENCOUNTER — Ambulatory Visit
Admission: RE | Admit: 2023-01-28 | Discharge: 2023-01-28 | Disposition: A | Discharge status: Home / Self Care | Payer: 59 | Source: Ambulatory Visit | Attending: Physician Assistant | Admitting: Physician Assistant

## 2023-01-28 DIAGNOSIS — D4989 Neoplasm of unspecified behavior of other specified sites: Secondary | ICD-10-CM

## 2023-01-28 DIAGNOSIS — F17219 Nicotine dependence, cigarettes, with unspecified nicotine-induced disorders: Secondary | ICD-10-CM

## 2023-02-03 ENCOUNTER — Encounter: Payer: Self-pay | Admitting: Physician Assistant

## 2023-02-08 ENCOUNTER — Ambulatory Visit (INDEPENDENT_AMBULATORY_CARE_PROVIDER_SITE_OTHER): Payer: 59 | Admitting: Gastroenterology

## 2023-02-08 ENCOUNTER — Other Ambulatory Visit: Payer: Self-pay

## 2023-02-08 ENCOUNTER — Encounter: Payer: Self-pay | Admitting: Gastroenterology

## 2023-02-08 VITALS — BP 152/88 | HR 86 | Temp 98.0°F | Ht 64.0 in | Wt 166.5 lb

## 2023-02-08 DIAGNOSIS — R7401 Elevation of levels of liver transaminase levels: Secondary | ICD-10-CM

## 2023-02-08 DIAGNOSIS — R140 Abdominal distension (gaseous): Secondary | ICD-10-CM | POA: Diagnosis not present

## 2023-02-08 DIAGNOSIS — R7989 Other specified abnormal findings of blood chemistry: Secondary | ICD-10-CM

## 2023-02-08 DIAGNOSIS — R748 Abnormal levels of other serum enzymes: Secondary | ICD-10-CM

## 2023-02-08 DIAGNOSIS — R1310 Dysphagia, unspecified: Secondary | ICD-10-CM

## 2023-02-08 DIAGNOSIS — K219 Gastro-esophageal reflux disease without esophagitis: Secondary | ICD-10-CM

## 2023-02-08 DIAGNOSIS — F17210 Nicotine dependence, cigarettes, uncomplicated: Secondary | ICD-10-CM

## 2023-02-08 DIAGNOSIS — F1090 Alcohol use, unspecified, uncomplicated: Secondary | ICD-10-CM

## 2023-02-08 NOTE — Progress Notes (Signed)
Arlyss Repress, MD [address]  [address]  [place], [place] [address]  Main: [phone]  Fax: [phone]    Gastroenterology Consultation  Referring Provider:     Carlean Jews, PA* Primary Care Physician:  Carlean Jews, PA-C Primary Gastroenterologist:  Dr. Arlyss Repress Reason for Consultation: Food stuck in the chest, bloating, spasm        HPI:   Deanna Howard is a 57 y.o. female referred by Carlean Jews, PA-C  for consultation & management of chronic symptoms of abdominal bloating, back pain from esophageal spasm, food not passing down smoothly.  She takes Excedrin 4 pills daily for chronic headaches and arthralgias as well as Tylenol daily.  Takes meloxicam as needed.  She reports trying several different PPIs for esophageal spasm, bloating does not seem to help.  She is on sucralfate currently, denies any constipation or diarrhea.  She does admit to drinking sodas, tea with artificial sweeteners, 4 drinks of alcohol daily, smokes tobacco.  She also states that she quit smoking for 10 years and also quit drinking for 1 year.  She is currently working from home, diagnosed with Graves' disease in January, underwent radioactive iodine treatment which was not successful.  Currently on methimazole.  Planning to undergo radioactive ablation again.  Her alkaline phosphatase and ALT are mildly elevated S/p cholecystectomy With regards to headaches, she attributes her headaches to sinusitis.  She was also seen by neurologist in the past Patient has known history of antral gastric ulcer, duodenitis.  Colonoscopy was unremarkable.  No evidence of H. pylori She currently works from home, has supportive husband, stressed about 2 since her in [employer] She does not feel depressed or anxious Does not exercise, very minimal physical activity  Follow-up visit 02/08/2023 Deanna Howard is here for follow-up of dysphagia and elevated LFTs.  She underwent radioactive iodine  therapy for hyperthyroidism, has been off methimazole for last 1 month.  She reports that she continues to feel better overall.  She underwent upper endoscopy for dysphagia, empirically dilated to 18 mm and started on Protonix 40 mg daily before breakfast.  Her upper GI symptoms have resolved.  Biopsies were unremarkable including duodenum, stomach and esophagus.  She continues to take Excedrin 2 pills in the morning with breakfast for generalized bodyaches, however she feels significantly better since stopping methimazole and regulating her thyroid levels  NSAIDs: Excedrin 4 pills daily on average, reduced to 2 pills daily  Antiplts/Anticoagulants/Anti thrombotics: None  GI Procedures: EGD and colonoscopy in [place] in [place], [place] It was done for epigastric abdominal pain, bloating, change in bowel function.  Postoperative diagnosis was GERD, distal gastritis, gastric ulcer in the gastric antrum near the pylorus, duodenitis, normal-appearing colonic mucosa.  Pathology showed no significant pathology in the small bowel biopsies.  Antral biopsies were normal as were likely with no H. pylori.  Distal esophageal biopsies showed reactive changes.  There were no colon biopsies done.   This colonoscopy report does not mention any abnormalities in the colon or at the appendiceal orifice.  Upper endoscopy 12/29/2022 - Normal esophagus. Dilated. - Gastritis. Biopsied. - Duodenitis. Biopsied. - Random biopsies were obtained in the middle third of the esophagus. 1. Small Bowel, cbx :       - HEALING EROSIVE DUODENITIS.       - NEGATIVE FOR DYSPLASIA AND MALIGNANCY.        2. Stomach, biopsy, cbx :       - OXYNTIC  MUCOSA WITH FOCAL SUPERFICIAL STROMAL HEMORRHAGE, NON-SPECIFIC.       - NEGATIVE FOR H. PYLORI, INTESTINAL METAPLASIA, DYSPLASIA, AND MALIGNANCY.        3. Esophagus, biopsy, cbx :       - UNREMARKABLE SQUAMOUS MUCOSA.       - NEGATIVE FOR INTRAEPITHELIAL EOSINOPHILS, DYSPLASIA, AND  MALIGNANCY.   Past Medical History:  Diagnosis Date   Depression    Femoroacetabular impingement of both hips    Graves disease    History of duodenal ulcer    Hyperlipidemia    Osteoarthritis    Thyroid disease     Past Surgical History:  Procedure Laterality Date   APPENDECTOMY     BALLOON DILATION  12/29/2022   Procedure: BALLOON DILATION;  Surgeon: Midge Minium, MD;  Location: [facility] END[facility]COPY;  Service: Endoscopy;;   BIOPSY  12/29/2022   Procedure: BIOPSY;  Surgeon: Midge Minium, MD;  Location: [facility]COPY;  Service: Endoscopy;;   CHOLECYSTECTOMY     COLON SURGERY     removed tumor from colon   COLONOSCOPY     ESOPHAGOGASTRODUODENOSCOPY (EGD) WITH PROPOFOL N/A 12/29/2022   Procedure: ESOPHAGOGASTRODUODENOSCOPY (EGD) WITH PROPOFOL;  Surgeon: Midge Minium, MD;  Location: [facility] ENDOSCOPY;  Service: Endoscopy;  Laterality: N/A;   HIP SURGERY     cartiledge repair, shaving of the bone.   ROTATOR CUFF REPAIR     SPINAL FUSION     UPPER GASTROINTESTINAL ENDOSCOPY       Current Outpatient Medications:    ALPRAZolam (XANAX) 0.25 MG tablet, TAKE HALF TO ONE TAB 2 X DAY as needed, Disp: 30 tablet, Rfl: 1   aspirin-acetaminophen-caffeine (EXCEDRIN MIGRAINE) 250-250-65 MG tablet, Take 2 tablets by mouth every 6 (six) hours as needed for headache., Disp: , Rfl:    buPROPion (WELLBUTRIN XL) 150 MG 24 hr tablet, Take 1 tablet (150 mg total) by mouth daily., Disp: 90 tablet, Rfl: 1   FLUoxetine (PROZAC) 10 MG capsule, TAKE 1 CAPSULE(10 MG) BY MOUTH DAILY, Disp: 90 capsule, Rfl: 1   furosemide (LASIX) 20 MG tablet, Take 1 tablet (20 mg total) by mouth daily., Disp: 90 tablet, Rfl: 1   meloxicam (MOBIC) 15 MG tablet, Take 1 tablet (15 mg total) by mouth daily., Disp: 30 tablet, Rfl: 3   methimazole (TAPAZOLE) 10 MG tablet, Take 10 mg by mouth daily., Disp: , Rfl:    ondansetron (ZOFRAN) 4 MG tablet, Take 1 tablet (4 mg total) by mouth every 8 (eight) hours as needed for nausea or vomiting.,  Disp: 20 tablet, Rfl: 0   pantoprazole (PROTONIX) 40 MG tablet, Take 1 tablet (40 mg total) by mouth daily., Disp: 90 tablet, Rfl: 3   potassium chloride SA (KLOR-CON M) 20 MEQ tablet, Take 1 tablet (20 mEq total) by mouth daily as needed (for leg swelling)., Disp: 30 tablet, Rfl: 3   propranolol ER (INDERAL LA) 60 MG 24 hr capsule, Take 60 mg by mouth daily., Disp: , Rfl:    sucralfate (CARAFATE) 1 g tablet, Take 1 tablet (1 g total) by mouth daily as needed. TAKE 1 TABLET(1 GRAM) BY MOUTH DAILY AS NEEDED FOR NAUSEA OR ABDOMINAL PAIN, Disp: 90 tablet, Rfl: 0   Family History  Problem Relation Age of Onset   Neuropathy Mother    Hypertension Mother    Heart disease Father    Kidney disease Father    Hypertension Father    Heart attack Father    COPD Father    Hypertension Brother  Heart disease Brother    Heart disease Brother    Hypertension Brother    Heart disease Brother    Hypertension Brother      Social History   Tobacco Use   Smoking status: Every Day    Current packs/day: 1.00    Average packs/day: 1 pack/day for 30.0 years (30.0 ttl pk-yrs)    Types: Cigarettes   Smokeless tobacco: Never   Tobacco comments:    Smokes 1/2 pack daily  Vaping Use   Vaping status: Never Used  Substance Use Topics   Alcohol use: Yes    Alcohol/week: 28.0 standard drinks of alcohol    Types: 28 Cans of beer per week    Comment: States she has 3 to 4 white claw per night   Drug use: Never    Allergies as of 02/08/2023   (No Known Allergies)    Review of Systems:    All systems reviewed and negative except where noted in HPI.   Physical Exam:  BP (!) 152/88 (BP Location: Left Arm, Patient Position: Sitting, Cuff Size: Normal)   Pulse 86   Temp 98 F (36.7 C) (Oral)   Ht 5\' 4"  (1.626 m)   Wt 166 lb 8 oz (75.5 kg)   BMI 28.58 kg/m  No LMP recorded. Patient is postmenopausal.  General:   Alert,  Well-developed, well-nourished, pleasant and cooperative in NAD Head:   Normocephalic and atraumatic. Eyes:  Sclera clear, no icterus.   Conjunctiva pink. Ears:  Normal auditory acuity. Nose:  No deformity, discharge, or lesions. Mouth:  No deformity or lesions,oropharynx pink & moist. Neck:  Supple; no masses or thyromegaly. Lungs:  Respirations even and unlabored.  Clear throughout to auscultation.   No wheezes, crackles, or rhonchi. No acute distress. Heart:  Regular rate and rhythm; no murmurs, clicks, rubs, or gallops. Abdomen:  Normal bowel sounds. Soft, non-tender and non-distended without masses, hepatosplenomegaly or hernias noted.  No guarding or rebound tenderness.   Rectal: Not performed Msk:  Symmetrical without gross deformities. Good, equal movement & strength bilaterally. Pulses:  Normal pulses noted. Extremities:  No clubbing or edema.  No cyanosis. Neurologic:  Alert and oriented x3;  grossly normal neurologically. Skin:  Intact without significant lesions or rashes. No jaundice. Psych:  Alert and cooperative. Normal mood and affect.  Imaging Studies: Reviewed  Assessment and Plan:   Deanna Howard is a 57 y.o. female with history of Graves' disease on methimazole, history of peptic ulcer disease, is seen in consultation for chronic symptoms of abdominal bloating, esophageal spasm, food getting stuck in the throat, history of chronic tobacco, alcohol use  Dysphagia EGD in 10/24 was unremarkable, empirically dilated to 18 mm given her symptoms of dysphagia On Protonix 40 mg daily before breakfast, continue long-term, advised to increase to twice daily if she has any breakthrough episodes given long-term NSAID use   Elevated ALT and alkaline phosphatase S/p cholecystectomy Discussed about further workup with patient, however she would like to wait until she sees endocrinologist in December because she attributes her elevated liver enzymes to methimazole which she has stopped taking it about a month ago.  Advised her to repeat LFTs in December  when she sees her endocrinologist and call our office back if her liver enzymes are persistently elevated   Follow up in 3 months with PA-C, Lavonna Monarch, MD

## 2023-02-09 ENCOUNTER — Encounter: Payer: Self-pay | Admitting: Gastroenterology

## 2023-02-09 DIAGNOSIS — R7989 Other specified abnormal findings of blood chemistry: Secondary | ICD-10-CM

## 2023-02-11 ENCOUNTER — Ambulatory Visit: Payer: 59 | Admitting: Physician Assistant

## 2023-02-16 ENCOUNTER — Encounter: Payer: Self-pay | Admitting: Gastroenterology

## 2023-02-16 ENCOUNTER — Ambulatory Visit
Admission: RE | Admit: 2023-02-16 | Discharge: 2023-02-16 | Disposition: A | Discharge status: Home / Self Care | Payer: 59 | Source: Ambulatory Visit | Attending: Gastroenterology | Admitting: Gastroenterology

## 2023-02-16 DIAGNOSIS — R7989 Other specified abnormal findings of blood chemistry: Secondary | ICD-10-CM | POA: Insufficient documentation

## 2023-02-18 ENCOUNTER — Ambulatory Visit (INDEPENDENT_AMBULATORY_CARE_PROVIDER_SITE_OTHER): Payer: 59 | Admitting: Physician Assistant

## 2023-02-18 ENCOUNTER — Encounter: Payer: Self-pay | Admitting: Physician Assistant

## 2023-02-18 VITALS — BP 120/70 | HR 80 | Temp 97.9°F | Resp 16 | Ht 64.0 in | Wt 171.0 lb

## 2023-02-18 DIAGNOSIS — D4989 Neoplasm of unspecified behavior of other specified sites: Secondary | ICD-10-CM | POA: Diagnosis not present

## 2023-02-18 DIAGNOSIS — Z5320 Procedure and treatment not carried out because of patient's decision for unspecified reasons: Secondary | ICD-10-CM | POA: Diagnosis not present

## 2023-02-18 DIAGNOSIS — I700 Atherosclerosis of aorta: Secondary | ICD-10-CM

## 2023-02-18 DIAGNOSIS — E0500 Thyrotoxicosis with diffuse goiter without thyrotoxic crisis or storm: Secondary | ICD-10-CM

## 2023-02-18 NOTE — Progress Notes (Signed)
[facility] [address] [place], [place] [address]  Internal MEDICINE  Office Visit Note  Patient Name: Deanna Howard  [id number]  [id number]  Date of Service: 02/18/2023  Chief Complaint  Patient presents with   Depression   Hyperlipidemia   Follow-up    Review Ct     HPI Pt is here for routine follow up to review CT chest -Ct chest done as follow up to possible thymoma on previous imaging last year as well as for lung screening. -CT results as below, pt reassured by findings and will cancel MRI since she requires anesthesia for this and CT interpretation did not think further imaging is necessary at this time.  -Did discuss aortic atherosclerosis finding and pt continues to decline statin. Has been working to control cholesterol with diet/exercise and had improved since 2 years ago -Pt also reports feeling much better since last RAI therapy for graves disease and does not feel as tired now.  IMPRESSION: 1. Unchanged since 07/07/2021 small volume of anterior superior mediastinal soft tissue most compatible with benign thymic remnant. Additional imaging follow-up unlikely to be valuable. No acute finding in the noncontrast Chest. 2.  Aortic Atherosclerosis (ICD10-I70.0).  Current Medication: Outpatient Encounter Medications as of 02/18/2023  Medication Sig   ALPRAZolam (XANAX) 0.25 MG tablet TAKE HALF TO ONE TAB 2 X DAY as needed   aspirin-acetaminophen-caffeine (EXCEDRIN MIGRAINE) 250-250-65 MG tablet Take 2 tablets by mouth every 6 (six) hours as needed for headache.   FLUoxetine (PROZAC) 10 MG capsule TAKE 1 CAPSULE(10 MG) BY MOUTH DAILY   furosemide (LASIX) 20 MG tablet Take 1 tablet (20 mg total) by mouth daily.   meloxicam (MOBIC) 15 MG tablet Take 1 tablet (15 mg total) by mouth daily.   ondansetron (ZOFRAN) 4 MG tablet Take 1 tablet (4 mg total) by mouth every 8 (eight) hours as needed for nausea or vomiting.   pantoprazole (PROTONIX) 40 MG tablet Take 1  tablet (40 mg total) by mouth daily.   potassium chloride SA (KLOR-CON M) 20 MEQ tablet Take 1 tablet (20 mEq total) by mouth daily as needed (for leg swelling).   propranolol ER (INDERAL LA) 60 MG 24 hr capsule Take 60 mg by mouth daily.   sucralfate (CARAFATE) 1 g tablet Take 1 tablet (1 g total) by mouth daily as needed. TAKE 1 TABLET(1 GRAM) BY MOUTH DAILY AS NEEDED FOR NAUSEA OR ABDOMINAL PAIN   No facility-administered encounter medications on file as of 02/18/2023.    Surgical History: Past Surgical History:  Procedure Laterality Date   APPENDECTOMY     BALLOON DILATION  12/29/2022   Procedure: BALLOON DILATION;  Surgeon: Midge Minium, MD;  Location: [facility] ENDOSCOPY;  Service: Endoscopy;;   BIOPSY  12/29/2022   Procedure: BIOPSY;  Surgeon: Midge Minium, MD;  Location: [facility] ENDOSCOPY;  Service: Endoscopy;;   CHOLECYSTECTOMY     COLON SURGERY     removed tumor from colon   COLONOSCOPY     ESOPHAGOGASTRODUODENOSCOPY (EGD) WITH PROPOFOL N/A 12/29/2022   Procedure: ESOPHAGOGASTRODUODENOSCOPY (EGD) WITH PROPOFOL;  Surgeon: Midge Minium, MD;  Location: [facility]COPY;  Service: Endoscopy;  Laterality: N/A;   HIP SURGERY     cartiledge repair, shaving of the bone.   ROTATOR CUFF REPAIR     SPINAL FUSION     UPPER GASTROINTESTINAL ENDOSCOPY      Medical History: Past Medical History:  Diagnosis Date   Depression    Femoroacetabular impingement of both hips    Graves  disease    History of duodenal ulcer    Hyperlipidemia    Osteoarthritis    Thyroid disease     Family History: Family History  Problem Relation Age of Onset   Neuropathy Mother    Hypertension Mother    Heart disease Father    Kidney disease Father    Hypertension Father    Heart attack Father    COPD Father    Hypertension Brother    Heart disease Brother    Heart disease Brother    Hypertension Brother    Heart disease Brother    Hypertension Brother     Social History   Socioeconomic History    Marital status: Married    Spouse name: Not on file   Number of children: Not on file   Years of education: Not on file   Highest education level: Not on file  Occupational History   Not on file  Tobacco Use   Smoking status: Every Day    Current packs/day: 1.00    Average packs/day: 1 pack/day for 30.0 years (30.0 ttl pk-yrs)    Types: Cigarettes   Smokeless tobacco: Never   Tobacco comments:    Smokes 1/2 pack daily  Vaping Use   Vaping status: Never Used  Substance and Sexual Activity   Alcohol use: Yes    Alcohol/week: 28.0 standard drinks of alcohol    Types: 28 Cans of beer per week    Comment: States she has 3 to 4 white claw per night   Drug use: Never   Sexual activity: Not on file  Other Topics Concern   Not on file  Social History Narrative   Not on file   Social Determinants of Health   Financial Resource Strain: Not on file  Food Insecurity: Not on file  Transportation Needs: Not on file  Physical Activity: Not on file  Stress: Not on file  Social Connections: Not on file  Intimate Partner Violence: Not on file      Review of Systems  Constitutional:  Negative for chills, fatigue and unexpected weight change.  HENT:  Negative for congestion, postnasal drip, rhinorrhea, sneezing and sore throat.   Eyes:  Negative for redness.  Respiratory:  Negative for cough, chest tightness and shortness of breath.   Cardiovascular:  Negative for chest pain and palpitations.  Gastrointestinal:  Negative for abdominal pain, constipation, diarrhea, nausea and vomiting.  Genitourinary:  Negative for dysuria and frequency.  Musculoskeletal:  Negative for arthralgias, back pain, joint swelling and neck pain.  Skin:  Negative for rash.  Neurological: Negative.  Negative for tremors and numbness.  Hematological:  Negative for adenopathy. Does not bruise/bleed easily.  Psychiatric/Behavioral:  Negative for behavioral problems (Depression), sleep disturbance and suicidal  ideas. The patient is not nervous/anxious.     Vital Signs: BP 120/70   Pulse 80   Temp 97.9 F (36.6 C)   Resp 16   Ht 5\' 4"  (1.626 m)   Wt 171 lb (77.6 kg)   SpO2 95%   BMI 29.35 kg/m    Physical Exam Vitals and nursing note reviewed.  Constitutional:      General: She is not in acute distress.    Appearance: Normal appearance. She is well-developed. She is not diaphoretic.  HENT:     Head: Normocephalic and atraumatic.     Mouth/Throat:     Pharynx: No oropharyngeal exudate.  Eyes:     Pupils: Pupils are equal, round, and reactive to  light.  Neck:     Thyroid: No thyromegaly.     Vascular: No JVD.     Trachea: No tracheal deviation.  Cardiovascular:     Rate and Rhythm: Normal rate and regular rhythm.     Heart sounds: Normal heart sounds. No murmur heard.    No friction rub. No gallop.  Pulmonary:     Effort: Pulmonary effort is normal. No respiratory distress.     Breath sounds: No wheezing or rales.  Chest:     Chest wall: No tenderness.  Abdominal:     General: Bowel sounds are normal.     Palpations: Abdomen is soft.  Musculoskeletal:        General: Normal range of motion.     Cervical back: Normal range of motion and neck supple.  Lymphadenopathy:     Cervical: No cervical adenopathy.  Skin:    General: Skin is warm and dry.  Neurological:     Mental Status: She is alert and oriented to person, place, and time.     Cranial Nerves: No cranial nerve deficit.  Psychiatric:        Behavior: Behavior normal.        Thought Content: Thought content normal.        Judgment: Judgment normal.        Assessment/Plan: 1. 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 Aortic atherosclerosis (HCC) Pt declines statin, continues to work on diet/exercise to help cholesterol  4. Statin declined Declines statin at this  time   General Counseling: Jasmine Awe understanding of the findings of todays visit and agrees with plan of treatment. I have discussed any further diagnostic evaluation that may be needed or ordered today. We also reviewed her medications today. she has been encouraged to call the office with any questions or concerns that should arise related to todays visit.    No orders of the defined types were placed in this encounter.   No orders of the defined types were placed in this encounter.   This patient was seen by Lynn Ito, PA-C in collaboration with Dr. Beverely Risen as a part of collaborative care agreement.   Total time spent:30 Minutes Time spent includes review of chart, medications, test results, and follow up plan with the patient.      Dr Lyndon Code Internal medicine

## 2023-03-15 ENCOUNTER — Telehealth: Payer: Self-pay | Admitting: Physician Assistant

## 2023-03-15 NOTE — Telephone Encounter (Signed)
Lvm regarding chest MRI-Toni

## 2023-03-16 ENCOUNTER — Telehealth: Payer: Self-pay | Admitting: Physician Assistant

## 2023-03-16 NOTE — Telephone Encounter (Signed)
Left another, and sent message regarding chest MRI scheduled for [facility]

## 2023-04-08 ENCOUNTER — Ambulatory Visit (HOSPITAL_COMMUNITY): Admit: 2023-04-08 | Payer: 59

## 2023-04-08 ENCOUNTER — Ambulatory Visit (HOSPITAL_COMMUNITY): Payer: 59

## 2023-04-08 SURGERY — MRI WITH ANESTHESIA
Anesthesia: General

## 2023-04-28 ENCOUNTER — Other Ambulatory Visit: Payer: Self-pay | Admitting: Physician Assistant

## 2023-04-28 DIAGNOSIS — I10 Essential (primary) hypertension: Secondary | ICD-10-CM

## 2023-04-28 DIAGNOSIS — F411 Generalized anxiety disorder: Secondary | ICD-10-CM

## 2023-04-28 NOTE — Telephone Encounter (Signed)
Last 11/24 and next 4/25

## 2023-05-08 ENCOUNTER — Other Ambulatory Visit: Payer: Self-pay | Admitting: Cardiovascular Disease

## 2023-07-19 ENCOUNTER — Encounter: Payer: Self-pay | Admitting: Physician Assistant

## 2023-07-19 ENCOUNTER — Ambulatory Visit (INDEPENDENT_AMBULATORY_CARE_PROVIDER_SITE_OTHER): Payer: 59 | Admitting: Physician Assistant

## 2023-07-19 VITALS — BP 130/82 | HR 79 | Temp 97.8°F | Resp 16 | Ht 64.0 in | Wt 177.0 lb

## 2023-07-19 DIAGNOSIS — E7800 Pure hypercholesterolemia, unspecified: Secondary | ICD-10-CM

## 2023-07-19 DIAGNOSIS — I5032 Chronic diastolic (congestive) heart failure: Secondary | ICD-10-CM

## 2023-07-19 DIAGNOSIS — E559 Vitamin D deficiency, unspecified: Secondary | ICD-10-CM

## 2023-07-19 DIAGNOSIS — R300 Dysuria: Secondary | ICD-10-CM

## 2023-07-19 DIAGNOSIS — R5383 Other fatigue: Secondary | ICD-10-CM

## 2023-07-19 DIAGNOSIS — E538 Deficiency of other specified B group vitamins: Secondary | ICD-10-CM

## 2023-07-19 DIAGNOSIS — N941 Unspecified dyspareunia: Secondary | ICD-10-CM

## 2023-07-19 LAB — POCT URINALYSIS DIPSTICK
Bilirubin, UA: NEGATIVE
Blood, UA: NEGATIVE
Glucose, UA: NEGATIVE
Ketones, UA: NEGATIVE
Leukocytes, UA: NEGATIVE
Nitrite, UA: NEGATIVE
Protein, UA: NEGATIVE
Spec Grav, UA: 1.01 (ref 1.010–1.025)
Urobilinogen, UA: 0.2 U/dL
pH, UA: 5 (ref 5.0–8.0)

## 2023-07-19 MED ORDER — PANTOPRAZOLE SODIUM 40 MG PO TBEC: 40.0000 mg | DELAYED_RELEASE_TABLET | Freq: Two times a day (BID) | ORAL | 3 refills | Status: AC | PRN

## 2023-07-19 MED ORDER — FUROSEMIDE 20 MG PO TABS: 20.0000 mg | ORAL_TABLET | Freq: Every day | ORAL | 1 refills | Status: DC

## 2023-07-19 NOTE — Progress Notes (Signed)
 [facility] [address] [place], [place] [address]  Internal MEDICINE  Office Visit Note  Patient Name: Deanna Howard  [id number]  [id number]  Date of Service: 07/19/2023  Chief Complaint  Patient presents with   Follow-up   Hyperlipidemia   Shortness of Breath   Urinary Tract Infection    HPI Pt is here for routine follow up -Some pain with intercourse recently, with or without lubricant. No discharge or vaginal bleeding/spotting -A little lower abdominal pressure, but no frequency or urgency. Dip in office negative.  -May follow up with GYN, discussed possible pelvic US  if not improving  -Will have some SOB at times, has increased lasix  a few times which helps some. Taking 2 lasix  tabs most days, occasionally will take a third and this helps. Some days doesn't need any. No ankle swelling. No wheezing or coughing. Followed by cardiology and will call for sooner follow up if continuing to need extra tabs -pantoprazole  BID at times per GI and needs sent for twice daily dosing so she doesn't run out -will order labs for CPE -graves' disease followed by endo and reports this is doing well. On methimazole  Current Medication: Outpatient Encounter Medications as of 07/19/2023  Medication Sig   ALPRAZolam  (XANAX ) 0.25 MG tablet TAKE 1/2 TO 1 TABLET BY MOUTH TWICE DAILY AS NEEDED   aspirin-acetaminophen-caffeine (EXCEDRIN MIGRAINE) 250-250-65 MG tablet Take 2 tablets by mouth every 6 (six) hours as needed for headache.   FLUoxetine  (PROZAC ) 10 MG capsule TAKE 1 CAPSULE(10 MG) BY MOUTH DAILY   meloxicam  (MOBIC ) 15 MG tablet Take 1 tablet (15 mg total) by mouth daily.   methimazole (TAPAZOLE) 5 MG tablet Take 5 mg by mouth daily.   ondansetron  (ZOFRAN ) 4 MG tablet Take 1 tablet (4 mg total) by mouth every 8 (eight) hours as needed for nausea or vomiting.   potassium chloride  SA (KLOR-CON  M) 20 MEQ tablet Take 1 tablet (20 mEq total) by mouth daily as needed (for leg swelling).    propranolol  ER (INDERAL  LA) 60 MG 24 hr capsule Take 60 mg by mouth daily.   sucralfate  (CARAFATE ) 1 g tablet Take 1 tablet (1 g total) by mouth daily as needed. TAKE 1 TABLET(1 GRAM) BY MOUTH DAILY AS NEEDED FOR NAUSEA OR ABDOMINAL PAIN   [DISCONTINUED] furosemide  (LASIX ) 20 MG tablet Take 1 tablet (20 mg total) by mouth daily.   [DISCONTINUED] pantoprazole  (PROTONIX ) 40 MG tablet Take 1 tablet (40 mg total) by mouth daily.   furosemide  (LASIX ) 20 MG tablet Take 1 tablet (20 mg total) by mouth daily.   pantoprazole  (PROTONIX ) 40 MG tablet Take 1 tablet (40 mg total) by mouth 2 (two) times daily as needed.   No facility-administered encounter medications on file as of 07/19/2023.    Surgical History: Past Surgical History:  Procedure Laterality Date   APPENDECTOMY     BALLOON DILATION  12/29/2022   Procedure: BALLOON DILATION;  Surgeon: Marnee Sink, MD;  Location: [facility] ENDOSCOPY;  Service: Endoscopy;;   BIOPSY  12/29/2022   Procedure: BIOPSY;  Surgeon: Marnee Sink, MD;  Location: [facility] ENDOSCOPY;  Service: Endoscopy;;   CHOLECYSTECTOMY     COLON SURGERY     removed tumor from colon   COLONOSCOPY     ESOPHAGOGASTRODUODENOSCOPY (EGD) WITH PROPOFOL  N/A 12/29/2022   Procedure: ESOPHAGOGASTRODUODENOSCOPY (EGD) WITH PROPOFOL ;  Surgeon: Marnee Sink, MD;  Location: [facility] ENDOSCOPY;  Service: Endoscopy;  Laterality: N/A;   HIP SURGERY     cartiledge repair, shaving of  the bone.   ROTATOR CUFF REPAIR     SPINAL FUSION     UPPER GASTROINTESTINAL ENDOSCOPY      Medical History: Past Medical History:  Diagnosis Date   Depression    Femoroacetabular impingement of both hips    Graves disease    History of duodenal ulcer    Hyperlipidemia    Osteoarthritis    Thyroid  disease     Family History: Family History  Problem Relation Age of Onset   Neuropathy Mother    Hypertension Mother    Heart disease Father    Kidney disease Father    Hypertension Father    Heart attack Father     COPD Father    Hypertension Brother    Heart disease Brother    Heart disease Brother    Hypertension Brother    Heart disease Brother    Hypertension Brother     Social History   Socioeconomic History   Marital status: Married    Spouse name: Not on file   Number of children: Not on file   Years of education: Not on file   Highest education level: Not on file  Occupational History   Not on file  Tobacco Use   Smoking status: Every Day    Current packs/day: 1.00    Average packs/day: 1 pack/day for 30.0 years (30.0 ttl pk-yrs)    Types: Cigarettes   Smokeless tobacco: Never   Tobacco comments:    Smokes 1/2 pack daily  Vaping Use   Vaping status: Never Used  Substance and Sexual Activity   Alcohol use: Yes    Alcohol/week: 28.0 standard drinks of alcohol    Types: 28 Cans of beer per week    Comment: States she has 3 to 4 white claw per night   Drug use: Never   Sexual activity: Not on file  Other Topics Concern   Not on file  Social History Narrative   Not on file   Social Drivers of Health   Financial Resource Strain: Not on file  Food Insecurity: Not on file  Transportation Needs: Not on file  Physical Activity: Not on file  Stress: Not on file  Social Connections: Not on file  Intimate Partner Violence: Not on file      Review of Systems  Constitutional:  Negative for chills and unexpected weight change.  HENT:  Negative for congestion, postnasal drip, rhinorrhea, sneezing and sore throat.   Eyes:  Negative for redness.  Respiratory:  Positive for shortness of breath. Negative for cough and chest tightness.        Intermittent SOB  Cardiovascular:  Negative for chest pain, palpitations and leg swelling.  Gastrointestinal:  Negative for abdominal pain, constipation, diarrhea, nausea and vomiting.  Genitourinary:  Positive for dyspareunia. Negative for difficulty urinating, dysuria, frequency, urgency, vaginal bleeding and vaginal discharge.        Suprapubic pressure at times  Musculoskeletal:  Negative for arthralgias, back pain, joint swelling and neck pain.  Skin:  Negative for rash.  Neurological: Negative.  Negative for tremors and numbness.  Hematological:  Negative for adenopathy. Does not bruise/bleed easily.  Psychiatric/Behavioral:  Negative for behavioral problems (Depression), sleep disturbance and suicidal ideas. The patient is not nervous/anxious.     Vital Signs: BP 130/82   Pulse 79   Temp 97.8 F (36.6 C)   Resp 16   Ht 5\' 4"  (1.626 m)   Wt 177 lb (80.3 kg)   SpO2 97%  BMI 30.38 kg/m    Physical Exam Vitals and nursing note reviewed.  Constitutional:      General: She is not in acute distress.    Appearance: Normal appearance. She is well-developed. She is not diaphoretic.  HENT:     Head: Normocephalic and atraumatic.  Eyes:     Pupils: Pupils are equal, round, and reactive to light.  Neck:     Thyroid : No thyromegaly.     Vascular: No JVD.     Trachea: No tracheal deviation.  Cardiovascular:     Rate and Rhythm: Normal rate and regular rhythm.     Heart sounds: Normal heart sounds. No murmur heard.    No friction rub. No gallop.  Pulmonary:     Effort: Pulmonary effort is normal. No respiratory distress.     Breath sounds: No wheezing, rhonchi or rales.  Chest:     Chest wall: No tenderness.  Musculoskeletal:        General: Normal range of motion.     Cervical back: Normal range of motion and neck supple.     Right lower leg: No edema.     Left lower leg: No edema.  Lymphadenopathy:     Cervical: No cervical adenopathy.  Skin:    General: Skin is warm and dry.  Neurological:     Mental Status: She is alert and oriented to person, place, and time.     Cranial Nerves: No cranial nerve deficit.  Psychiatric:        Behavior: Behavior normal.        Thought Content: Thought content normal.        Judgment: Judgment normal.        Assessment/Plan: 1. Chronic diastolic CHF  (congestive heart failure) (HCC) (Primary) Intermittent SOB and takes extra lasix  prn which helps. Will follow up with cardiology  2. Dyspareunia in female Will monitor, call if symptoms continue or if any new or worsening symptoms arise. May follow up with GYN.   3. Dysuria - POCT Urinalysis Dipstick negative  4. Vitamin D  deficiency - VITAMIN D  25 Hydroxy (Vit-D Deficiency, Fractures)  5. Pure hypercholesterolemia - Lipid Panel With LDL/HDL Ratio  6. B12 deficiency - B12 and Folate Panel  7. Other fatigue - CBC w/Diff/Platelet - Comprehensive metabolic panel with GFR - Lipid Panel With LDL/HDL Ratio - B12 and Folate Panel - VITAMIN D  25 Hydroxy (Vit-D Deficiency, Fractures) - Fe+TIBC+Fer   General Counseling: Breean verbalizes understanding of the findings of todays visit and agrees with plan of treatment. I have discussed any further diagnostic evaluation that may be needed or ordered today. We also reviewed her medications today. she has been encouraged to call the office with any questions or concerns that should arise related to todays visit.    Orders Placed This Encounter  Procedures   CBC w/Diff/Platelet   Comprehensive metabolic panel with GFR   Lipid Panel With LDL/HDL Ratio   B12 and Folate Panel   VITAMIN D  25 Hydroxy (Vit-D Deficiency, Fractures)   Fe+TIBC+Fer   POCT Urinalysis Dipstick    Meds ordered this encounter  Medications   furosemide  (LASIX ) 20 MG tablet    Sig: Take 1 tablet (20 mg total) by mouth daily.    Dispense:  90 tablet    Refill:  1   pantoprazole  (PROTONIX ) 40 MG tablet    Sig: Take 1 tablet (40 mg total) by mouth 2 (two) times daily as needed.    Dispense:  180 tablet  Refill:  3    This patient was seen by Taylor Favia, PA-C in collaboration with Dr. Verneta Gone as a part of collaborative care agreement.   Total time spent:30 Minutes Time spent includes review of chart, medications, test results, and follow up plan with  the patient.      Dr Fozia M Khan Internal medicine

## 2023-07-26 ENCOUNTER — Other Ambulatory Visit: Payer: Self-pay | Admitting: Physician Assistant

## 2023-08-12 NOTE — Progress Notes (Signed)
 Cardiology Clinic Note   Date: 08/16/2023 ID: Deanna Howard, DOB 12-02-1965, MRN [id number]  Primary Cardiologist:  Belva Boyden, MD  Chief Complaint   Deanna Howard is a 58 y.o. female who presents to the clinic today for evaluation of dyspnea.   Patient Profile   Deanna Howard is followed by Dr. Gollan for the history outlined below.      Past medical history significant for: Chronic diastolic heart failure. Echo 07/23/2021: EF 60 to 65%.  No RWMA.  Mild LVH.  Grade II DD.  Normal RV function.  Mild RVH.  Mild MR/AI. Palpitations. Carotid bruit. Carotid duplex 03/03/2022: No evidence of ICA stenosis bilaterally. Hyperlipidemia. Graves' disease. Tobacco abuse.  In summary, patient was first evaluated by Dr. Gollan on 07/08/2021 for shortness of breath.  She was evaluated in the emergency room the day prior.  She reported lower extremity edema starting in February 2023 increasing over the prior 2 weeks.  She also reported a 1 week history of worsening shortness of breath at night positive for PND.  While in the ED she was diuresed with IV Lasix  and discharged with p.o. Lasix  and potassium.  She underwent echo which demonstrated normal LV/RV function, mild LVH/RVH as detailed above.  Patient was last seen in the office by Dr. Gollan on 01/18/2023 for routine follow-up.  She was euvolemic with well-controlled BP.  No medication changes were made     History of Present Illness    Today, patient reports a 1 month history of increased shortness of breath with exertion. She reports in the morning she feels overall normal but throughout the day she notices more dyspnea with exertion. Dyspnea resolves with rest.  She denies chest pain, pressure or tightness. No increased lower extremity edema or PND. She works from home at [employer] job. She is mostly sedentary otherwise. She is able to do light to moderate household activities. She did feel slightly winded when walking into the building this  morning. She denies palpitations.       ROS: All other systems reviewed and are otherwise negative except as noted in History of Present Illness.  EKGs/Labs Reviewed    EKG Interpretation Date/Time:  Monday Aug 16 2023 08:48:55 EDT Ventricular Rate:  82 PR Interval:  118 QRS Duration:  86 QT Interval:  374 QTC Calculation: 436 R Axis:   66  Text Interpretation: Normal sinus rhythm Nonspecific ST elevation similar to previous. Now with delta wave. Reviewed with interventional cardiologist (Dr. Alvenia Aus) When compared with ECG of 18-Jan-2023 09:05, Confirmed by Morey Ar [phone]) on 08/16/2023 9:07:30 AM   10/27/2022: ALT 34; AST 18; BUN 12; Creatinine, Ser 0.55; Potassium 4.1; Sodium 141   10/27/2022: Hemoglobin 13.8; WBC 6.1    Physical Exam    VS:  BP 138/76 (BP Location: Left Arm, Patient Position: Sitting, Cuff Size: Normal)   Pulse 82   Wt 178 lb (80.7 kg)   SpO2 98%   BMI 30.55 kg/m  , BMI Body mass index is 30.55 kg/m.  GEN: Well nourished, well developed, in no acute distress. Neck: No JVD or carotid bruits. Cardiac:  RRR. No murmurs. No rubs or gallops.   Respiratory:  Respirations regular and unlabored. Clear to auscultation without rales, wheezing or rhonchi. GI: Soft, nontender, nondistended. Extremities: Radials/DP/PT 2+ and equal bilaterally. No clubbing or cyanosis. No edema.  Skin: Warm and dry, no rash. Neuro: Strength intact.  Assessment & Plan   Chronic diastolic heart failure/DOE Echo April 2023 demonstrated normal  LV/RV function, mild LVH/RVH, Grade II DD, mild MR/AI.  Patient reports a 1 month history of increased dyspnea with minimal exertion. She feels in the mornings it is not typically an issue but as the day progresses she notes increased exertional dyspnea. No associated chest pain, lower extremity edema, orthopnea or PND. She is mostly sedentary other than light to moderate household tasks. She works from home at [employer] job.  Euvolemic and  well compensated on exam. Concern that dyspnea could be anginal equivalent. EKG shows ST elevation similar to previous EKG in October 2024. Reviewed EKG with Dr. Alvenia Aus who felt EKG was not indicative of STEMI.  - Update echo. - Coronary CTA.  -Continue Lasix  and propranolol .  Palpitations Patient denies palpitations. RRR on exam today.  - Continue propranolol .  Elevated  BP without hypertension BP today 150/78 on intake and 138/76 on my recheck. Home BP typically in the 140s/80s. She will keep a BP log to bring to follow up.  - Home BP log to next visit.   Disposition: CBC and BMP today. Echo, coronary CTA. Return in 6-8 weeks or sooner as needed.          Signed, Lonell Rives. Thersia Petraglia, DNP, NP-C

## 2023-08-13 ENCOUNTER — Other Ambulatory Visit: Payer: Self-pay | Admitting: Physician Assistant

## 2023-08-13 DIAGNOSIS — F411 Generalized anxiety disorder: Secondary | ICD-10-CM

## 2023-08-16 ENCOUNTER — Ambulatory Visit: Attending: Student | Admitting: Student

## 2023-08-16 ENCOUNTER — Encounter: Payer: Self-pay | Admitting: Student

## 2023-08-16 VITALS — BP 138/76 | HR 82 | Wt 178.0 lb

## 2023-08-16 DIAGNOSIS — R030 Elevated blood-pressure reading, without diagnosis of hypertension: Secondary | ICD-10-CM | POA: Diagnosis not present

## 2023-08-16 DIAGNOSIS — I5032 Chronic diastolic (congestive) heart failure: Secondary | ICD-10-CM | POA: Diagnosis not present

## 2023-08-16 DIAGNOSIS — R0602 Shortness of breath: Secondary | ICD-10-CM | POA: Diagnosis not present

## 2023-08-16 DIAGNOSIS — I200 Unstable angina: Secondary | ICD-10-CM

## 2023-08-16 DIAGNOSIS — R0609 Other forms of dyspnea: Secondary | ICD-10-CM

## 2023-08-16 DIAGNOSIS — Z79899 Other long term (current) drug therapy: Secondary | ICD-10-CM

## 2023-08-16 MED ORDER — METOPROLOL TARTRATE 100 MG PO TABS: ORAL_TABLET | ORAL | 0 refills | Status: DC

## 2023-08-16 NOTE — Patient Instructions (Addendum)
 Medication Instructions:  Your Physician recommend you continue on your current medication as directed.    *If you need a refill on your cardiac medications before your next appointment, please call your pharmacy*  Lab Work: Your provider would like for you to have following labs drawn today CBC, BMet.   If you have labs (blood work) drawn today and your tests are completely normal, you will receive your results only by: MyChart Message (if you have MyChart) OR A paper copy in the mail If you have any lab test that is abnormal or we need to change your treatment, we will call you to review the results.  Testing/Procedures: Your cardiac CT will be scheduled at :   [facility] [address] [place], [place] [address] [phone]  When scheduled at  [facility], please arrive 15 mins early for check-in and test prep.  Please follow these instructions carefully (unless otherwise directed):  An IV will be required for this test and Nitroglycerin will be given.  Hold all erectile dysfunction medications at least 3 days (72 hrs) prior to test. (Ie viagra, cialis, sildenafil, tadalafil, etc)   On the Night Before the Test: Be sure to Drink plenty of water. Do not consume any caffeinated/decaffeinated beverages or chocolate 12 hours prior to your test. Do not take any antihistamines 12 hours prior to your test.  On the Day of the Test: Drink plenty of water until 1 hour prior to the test. Do not eat any food 1 hour prior to test. You may take your regular medications prior to the test.  Take metoprolol  100 mg Once (Lopressor ) two hours prior to test. If you take Furosemide /Hydrochlorothiazide/Spironolactone/Chlorthalidone, please HOLD on the morning of the test. Patients who wear a continuous glucose monitor MUST remove the device prior to scanning. FEMALES- please wear underwire-free bra if available, avoid dresses & tight clothing        After the Test: Drink plenty of water. After receiving IV contrast, you may experience a mild flushed feeling. This is normal. On occasion, you may experience a mild rash up to 24 hours after the test. This is not dangerous. If this occurs, you can take Benadryl 25 mg, Zyrtec, Claritin, or Allegra and increase your fluid intake. (Patients taking Tikosyn should avoid Benadryl, and may take Zyrtec, Claritin, or Allegra) If you experience trouble breathing, this can be serious. If it is severe call 911 IMMEDIATELY. If it is mild, please call our office.  We will call to schedule your test 2-4 weeks out understanding that some insurance companies will need an authorization prior to the service being performed.   For more information and frequently asked questions, please visit our website : [email or web address]  For non-scheduling related questions, please contact the cardiac imaging nurse navigator should you have any questions/concerns: Cardiac Imaging Nurse Navigators Direct Office Dial: [phone]   For scheduling needs, including cancellations and rescheduling, please call [place], [phone].    Your physician has requested that you have an echocardiogram. Echocardiography is a painless test that uses sound waves to create images of your heart. It provides your doctor with information about the size and shape of your heart and how well your heart's chambers and valves are working.   You may receive an ultrasound enhancing agent through an IV if needed to better visualize your heart during the echo. This procedure takes approximately one hour.  There are no restrictions for this procedure.  This will take place  at [address] [address], [place] [address]  Please note: We ask at that you not bring children with you during ultrasound (echo/ vascular) testing. Due to room size and safety concerns, children are not allowed in the ultrasound rooms  during exams. Our front office staff cannot provide observation of children in our lobby area while testing is being conducted. An adult accompanying a patient to their appointment will only be allowed in the ultrasound room at the discretion of the ultrasound technician under special circumstances. We apologize for any inconvenience.   Follow-Up: At [facility], you and your health needs are our priority.  As part of our continuing mission to provide you with exceptional heart care, our providers are all part of one team.  This team includes your primary Cardiologist (physician) and Advanced Practice Providers or APPs (Physician Assistants and Nurse Practitioners) who all work together to provide you with the care you need, when you need it.  Your next appointment:   6 - 8 week(s)  Provider:   Morey Ar, NP    We recommend signing up for the patient portal called "MyChart".  Sign up information is provided on this After Visit Summary.  MyChart is used to connect with patients for Virtual Visits (Telemedicine).  Patients are able to view lab/test results, encounter notes, upcoming appointments, etc.  Non-urgent messages can be sent to your provider as well.   To learn more about what you can do with MyChart, go to [email or web address].

## 2023-08-17 ENCOUNTER — Ambulatory Visit: Payer: Self-pay | Admitting: Student

## 2023-08-17 LAB — CBC
Hematocrit: 43.1 % (ref 34.0–46.6)
Hemoglobin: 14.6 g/dL (ref 11.1–15.9)
MCH: 30.6 pg (ref 26.6–33.0)
MCHC: 33.9 g/dL (ref 31.5–35.7)
MCV: 90 fL (ref 79–97)
Platelets: 209 10*3/uL (ref 150–450)
RBC: 4.77 x10E6/uL (ref 3.77–5.28)
RDW: 12.7 % (ref 11.7–15.4)
WBC: 7.9 10*3/uL (ref 3.4–10.8)

## 2023-08-17 LAB — BASIC METABOLIC PANEL WITH GFR
BUN/Creatinine Ratio: 27 (ref 12–28)
BUN: 18 mg/dL (ref 8–27)
CO2: 23 mmol/L (ref 20–29)
Calcium: 9.9 mg/dL (ref 8.7–10.3)
Chloride: 102 mmol/L (ref 96–106)
Creatinine, Ser: 0.66 mg/dL (ref 0.57–1.00)
Glucose: 94 mg/dL (ref 70–99)
Potassium: 5.1 mmol/L (ref 3.5–5.2)
Sodium: 142 mmol/L (ref 134–144)
eGFR: 100 mL/min/{1.73_m2} (ref >59)

## 2023-08-18 ENCOUNTER — Other Ambulatory Visit: Payer: Self-pay | Admitting: Physician Assistant

## 2023-08-18 DIAGNOSIS — I10 Essential (primary) hypertension: Secondary | ICD-10-CM

## 2023-08-18 DIAGNOSIS — F411 Generalized anxiety disorder: Secondary | ICD-10-CM

## 2023-08-27 ENCOUNTER — Ambulatory Visit: Attending: Student

## 2023-08-27 DIAGNOSIS — R0602 Shortness of breath: Secondary | ICD-10-CM

## 2023-08-31 LAB — ECHOCARDIOGRAM COMPLETE
AR max vel: 2.72 cm2
AV Area VTI: 2.8 cm2
AV Area mean vel: 2.68 cm2
AV Mean grad: 3 mmHg
AV Peak grad: 6.4 mmHg
Ao pk vel: 1.26 m/s
Area-P 1/2: 3.91 cm2
Calc EF: 63.8 %
P 1/2 time: 366 ms
S' Lateral: 3 cm
Single Plane A2C EF: 61.4 %
Single Plane A4C EF: 62.5 %

## 2023-09-02 ENCOUNTER — Encounter (HOSPITAL_COMMUNITY): Payer: Self-pay

## 2023-09-06 ENCOUNTER — Ambulatory Visit
Admission: RE | Admit: 2023-09-06 | Discharge: 2023-09-06 | Disposition: A | Discharge status: Home / Self Care | Source: Ambulatory Visit | Attending: Student | Admitting: Student

## 2023-09-06 DIAGNOSIS — I120 Hypertensive chronic kidney disease with stage 5 chronic kidney disease or end stage renal disease: Secondary | ICD-10-CM | POA: Insufficient documentation

## 2023-09-06 MED ORDER — METOPROLOL TARTRATE 5 MG/5ML IV SOLN
INTRAVENOUS | Status: AC
Start: 2023-09-06 — End: ?
  Filled 2023-09-06: qty 10

## 2023-09-06 MED ORDER — DILTIAZEM HCL 25 MG/5ML IV SOLN
INTRAVENOUS | Status: AC
  Filled 2023-09-06: qty 5

## 2023-09-06 MED ORDER — METOPROLOL TARTRATE 5 MG/5ML IV SOLN
10.0000 mg | Freq: Once | INTRAVENOUS | Status: AC | PRN
  Administered 2023-09-06: 10 mg via INTRAVENOUS
  Filled 2023-09-06: qty 10

## 2023-09-06 MED ORDER — NITROGLYCERIN 0.4 MG SL SUBL
SUBLINGUAL_TABLET | SUBLINGUAL | Status: AC
  Filled 2023-09-06: qty 2

## 2023-09-06 MED ORDER — NITROGLYCERIN 0.4 MG SL SUBL
0.8000 mg | SUBLINGUAL_TABLET | Freq: Once | SUBLINGUAL | Status: DC
  Filled 2023-09-06: qty 25

## 2023-09-06 MED ORDER — IOHEXOL 350 MG/ML SOLN
80.0000 mL | Freq: Once | INTRAVENOUS | Status: AC | PRN
  Administered 2023-09-06: 75 mL via INTRAVENOUS

## 2023-09-06 MED ORDER — DILTIAZEM HCL 25 MG/5ML IV SOLN
10.0000 mg | INTRAVENOUS | Status: DC | PRN
  Administered 2023-09-06: 10 mg via INTRAVENOUS
  Filled 2023-09-06 (×2): qty 5

## 2023-09-06 NOTE — Progress Notes (Signed)
 Patients heart rate 70-73 in prep-bay. Pt taken to CT scanner and heart rate went to 80s. RN attempted to treat per protocol, please see MAR. HR did not come down to less than 70 and Dr. Junnie Olives notified. He suggested canceling the patient and scheduling for alternative testing. Cardiac Navigator Oakley Bellman. Notified.

## 2023-09-07 ENCOUNTER — Telehealth: Payer: Self-pay | Admitting: Student

## 2023-09-07 DIAGNOSIS — R0609 Other forms of dyspnea: Secondary | ICD-10-CM

## 2023-09-07 NOTE — Telephone Encounter (Signed)
 Spoke with patient regarding being unable to have coronary CTA secondary to elevated heart rate. Discussed options for ischemic evaluation. Patient would like to proceed with cardiac PET CT.  Informed Consent   Shared Decision Making/Informed Consent The risks [chest pain, shortness of breath, cardiac arrhythmias, dizziness, blood pressure fluctuations, myocardial infarction, stroke/transient ischemic attack, nausea, vomiting, allergic reaction, radiation exposure, metallic taste sensation and life-threatening complications (estimated to be 1 in 10,000)], benefits (risk stratification, diagnosing coronary artery disease, treatment guidance) and alternatives of a cardiac PET stress test were discussed in detail with Deanna Howard and she agrees to proceed.      Lonell Rives. Ronie Barnhart, DNP, NP-C  09/07/2023, 3:05 PM [facility] [address] Office [phone] Fax [phone]

## 2023-09-07 NOTE — Telephone Encounter (Signed)
 Message sent to [facility] Cardiac PET pool to have the patient scheduled for PET as soon as possible.

## 2023-09-08 ENCOUNTER — Telehealth (HOSPITAL_COMMUNITY): Payer: Self-pay | Admitting: Emergency Medicine

## 2023-09-08 NOTE — Telephone Encounter (Signed)

## 2023-09-09 ENCOUNTER — Ambulatory Visit
Admission: RE | Admit: 2023-09-09 | Discharge: 2023-09-09 | Disposition: A | Discharge status: Home / Self Care | Source: Ambulatory Visit | Attending: Student | Admitting: Student

## 2023-09-09 DIAGNOSIS — R0609 Other forms of dyspnea: Secondary | ICD-10-CM

## 2023-09-09 DIAGNOSIS — I700 Atherosclerosis of aorta: Secondary | ICD-10-CM | POA: Insufficient documentation

## 2023-09-09 MED ORDER — REGADENOSON 0.4 MG/5ML IV SOLN
INTRAVENOUS | Status: AC
Start: 2023-09-09 — End: 2023-09-09
  Filled 2023-09-09: qty 5

## 2023-09-09 MED ORDER — RUBIDIUM RB82 GENERATOR (RUBYFILL)
25.0000 | PACK | Freq: Once | INTRAVENOUS | Status: AC
  Administered 2023-09-09: 20.94 via INTRAVENOUS

## 2023-09-09 MED ORDER — REGADENOSON 0.4 MG/5ML IV SOLN
0.4000 mg | Freq: Once | INTRAVENOUS | Status: AC
  Administered 2023-09-09: 0.4 mg via INTRAVENOUS
  Filled 2023-09-09: qty 5

## 2023-09-11 LAB — NM PET CT CARDIAC PERFUSION MULTI W/ABSOLUTE BLOODFLOW
MBFR: 3.06
Nuc Rest EF: 55 %
Nuc Stress EF: 63 %
Peak HR: 112 {beats}/min
Rest HR: 93 {beats}/min
Rest MBF: 1.17 ml/g/min
Rest Nuclear Isotope Dose: 20.9 mCi
SRS: 0
SSS: 0
ST Depression (mm): 0 mm
Stress MBF: 3.58 ml/g/min
Stress Nuclear Isotope Dose: 20.9 mCi
TID: 0.98

## 2023-09-13 ENCOUNTER — Ambulatory Visit: Payer: Self-pay | Admitting: Student

## 2023-09-20 ENCOUNTER — Other Ambulatory Visit: Payer: Self-pay | Admitting: Physician Assistant

## 2023-09-20 DIAGNOSIS — F411 Generalized anxiety disorder: Secondary | ICD-10-CM

## 2023-09-20 DIAGNOSIS — I10 Essential (primary) hypertension: Secondary | ICD-10-CM

## 2023-09-20 NOTE — Telephone Encounter (Signed)
 Pt next appt 8/25

## 2023-09-24 NOTE — Progress Notes (Unsigned)
 Cardiology Clinic Note   Date: 09/28/2023 ID: Deanna Howard, DOB [birth year], MRN [id number]  Primary Cardiologist:  Evalene Lunger, MD  Chief Complaint   Deanna Howard is a 58 y.o. female who presents to the clinic today for follow up after testing.   Patient Profile   Deanna Howard is followed by Dr. Gollan for the history outlined below.       Past medical history significant for: Chronic diastolic heart failure. Echo 08/27/2023: EF 60 to 65%.  Regional wall motion unable to be evaluated.  Mild LVH.  Grade I DD.  Normal RV function.  Mild RVH.  Normal PA pressure.  Mild LAE.  Trivial MR.  Mild to moderate AI.  Cannot exclude small PFO. Dyspnea. Cardiac PET stress 09/09/2023: Normal, low risk study.  No evidence of ischemia or infarction. Palpitations. Carotid bruit. Carotid duplex 03/03/2022: No evidence of ICA stenosis bilaterally. Hypertension.  Hyperlipidemia. Graves' disease. Tobacco abuse.  In summary, patient was first evaluated by Dr. Gollan on 07/08/2021 for shortness of breath.  She was evaluated in the emergency room the day prior.  She reported lower extremity edema starting in February 2023 increasing over the prior 2 weeks.  She also reported a 1 week history of worsening shortness of breath at night positive for PND.  While in the ED she was diuresed with IV Lasix  and discharged with p.o. Lasix  and potassium.  She underwent echo which demonstrated normal LV/RV function, mild LVH/RVH as detailed above.  Patient was seen in the office on 01/18/2023 for routine follow-up.  She was euvolemic with well-controlled BP.  No medication changes were made   Patient was last seen in the office by me on 08/16/2023 for evaluation of dyspnea.  She reported a 1 month history of increased DOE.  She stated feeling normal in the morning but noticing increased exertional dyspnea throughout the day that resolved with rest.  EKG demonstrated ST elevation similar to previous EKG in October 2024.  This  was reviewed with Dr. Darron who felt EKG not indicative of STEMI.  Coronary CTA was unable to be performed secondary to elevated heart rate.  Patient was contacted and agreed to proceed with cardiac PET stress.     History of Present Illness    Today, patient reports she is about the same. She continues to have exertional dyspnea resolving with rest. She denies chest pain, pressure or tightness. 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 She continues to smoke 1/2-3/4 pack of cigarettes a day. Discussed results of echo and PET stress with patient in detail. All questions answered. BP has been trending higher. Home BP typically in the 140s/80s.     ROS: All other systems reviewed and are otherwise negative except as noted in History of Present Illness.  EKGs/Labs Reviewed        10/27/2022: ALT 34; AST 18 08/16/2023: BUN 18; Creatinine, Ser 0.66; Potassium 5.1; Sodium 142   08/16/2023: Hemoglobin 14.6; WBC 7.9   Risk Assessment/Calculations      HYPERTENSION CONTROL Vitals:   09/28/23 0755 09/28/23 0846  BP: (!) 142/78 (!) 142/74    The patient's blood pressure is elevated above target today.  In order to address the patient's elevated BP: A new medication was prescribed today.           Physical Exam    VS:  BP (!) 142/74 (BP Location: Left Arm, Patient Position: Sitting, Cuff Size: Normal)   Pulse 83  Ht 5' 4 (1.626 m)   Wt 179 lb 9.6 oz (81.5 kg)   SpO2 98%   BMI 30.83 kg/m  , BMI Body mass index is 30.83 kg/m.  GEN: Well nourished, well developed, in no acute distress. Neck: No JVD or carotid bruits. Cardiac:  RRR. No murmur. No rubs or gallops.   Respiratory:  Respirations regular and unlabored. Clear to auscultation without rales, wheezing or rhonchi. GI: Soft, nontender, nondistended. Extremities: Radials/DP/PT 2+ and equal bilaterally. No clubbing or cyanosis. No edema.  Skin: Warm and dry, no rash. Neuro: Strength  intact.  Assessment & Plan   Chronic diastolic heart failure/DOE Echo May 2025 demonstrated EF 60 to 65%, mild LVH/RVH, Grade I DD, normal RV function, trivial MR, mild to moderate AI.  Cardiac PET stress 09/09/2023 was a normal, low risk study with no evidence of ischemia or infarction.  Patient reports continued dyspnea with exertion that resolves with rest. No chest pain, pressure or tightness. 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  Discussed deconditioning and adding in physical activity as tolerated throughout the day.  - Continue Lasix . - Stop propranolol . Start carvedilol (see below).  - Increase physical activity as tolerated.   Hypertension BP today 142/78 on intake and 142/74 on my recheck. Home BP typically 140s/80s. No headache or dizziness reported.  - Stop propranolol .  - Start carvedilol 3.125 mg bid.  - Monitor BP daily x 1 week after starting medication. Contact office if BP >130/80.   Tobacco abuse Patient smokes 1/2-3/4 pack of cigarettes a day. She has quit successfully in the past. Discussed quitting strategies. - Encouraged complete cessation.   Disposition: Stop propranolol . Start carvedilol 3.12 mg bid.  Return in 3 months or sooner as needed.          Signed, Barnie HERO. Gerardo Caiazzo, DNP, NP-C

## 2023-09-28 ENCOUNTER — Encounter: Payer: Self-pay | Admitting: Student

## 2023-09-28 ENCOUNTER — Ambulatory Visit: Attending: Student | Admitting: Student

## 2023-09-28 VITALS — BP 142/74 | HR 83 | Ht 64.0 in | Wt 179.6 lb

## 2023-09-28 DIAGNOSIS — I5032 Chronic diastolic (congestive) heart failure: Secondary | ICD-10-CM

## 2023-09-28 DIAGNOSIS — Z720 Tobacco use: Secondary | ICD-10-CM | POA: Diagnosis not present

## 2023-09-28 DIAGNOSIS — I10 Essential (primary) hypertension: Secondary | ICD-10-CM

## 2023-09-28 DIAGNOSIS — R0609 Other forms of dyspnea: Secondary | ICD-10-CM | POA: Diagnosis not present

## 2023-09-28 MED ORDER — CARVEDILOL 3.125 MG PO TABS: 3.1250 mg | ORAL_TABLET | Freq: Two times a day (BID) | ORAL | 3 refills | Status: DC

## 2023-09-28 NOTE — Patient Instructions (Addendum)
 Medication Instructions:  Your physician recommends the following medication changes.  STOP TAKING: Propranolol   START TAKING: Carvedilol 3.125 mg 1 tablet two times daily  *If you need a refill on your cardiac medications before your next appointment, please call your pharmacy*  Lab Work: None ordered at this time  If you have labs (blood work) drawn today and your tests are completely normal, you will receive your results only by: MyChart Message (if you have MyChart) OR A paper copy in the mail If you have any lab test that is abnormal or we need to change your treatment, we will call you to review the results.  Testing/Procedures: None ordered at this time   Follow-Up: At [facility], you and your health needs are our priority.  As part of our continuing mission to provide you with exceptional heart care, our providers are all part of one team.  This team includes your primary Cardiologist (physician) and Advanced Practice Providers or APPs (Physician Assistants and Nurse Practitioners) who all work together to provide you with the care you need, when you need it.  Your next appointment:   3 month(s)  Provider:   Timothy Gollan, MD or Barnie Hila, NP    We recommend signing up for the patient portal called MyChart.  Sign up information is provided on this After Visit Summary.  MyChart is used to connect with patients for Virtual Visits (Telemedicine).  Patients are able to view lab/test results, encounter notes, upcoming appointments, etc.  Non-urgent messages can be sent to your provider as well.   To learn more about what you can do with MyChart, go to [email or web address].   Goal Blood Pressure: <130/80

## 2023-10-07 IMAGING — CT CT ABD-PELV W/ CM
2 of 5 series · 16 of 46 positions shown, 18 images · IV contrast (omnipaque)
Comparison: None.

CLINICAL DATA: Abdominal pain

EXAM:
CT ABDOMEN AND PELVIS WITH CONTRAST
TECHNIQUE: Multidetector CT imaging of the abdomen and pelvis was performed
using the standard protocol following bolus administration of
intravenous contrast.
CONTRAST:  100mL OMNIPAQUE IOHEXOL 300 MG/ML  SOLN

[Series 2: axial st · axial · 0.73mm/px · z∈[-609,-209]mm · 13 of 91 slices shown, 15 images]
[im 6/91  soft-tissue]
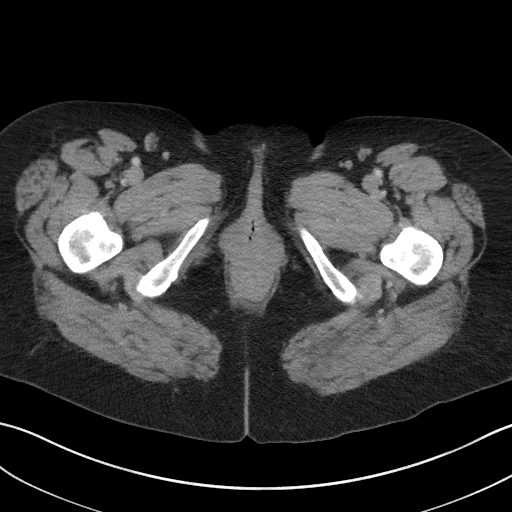
[im 6/91  bone]
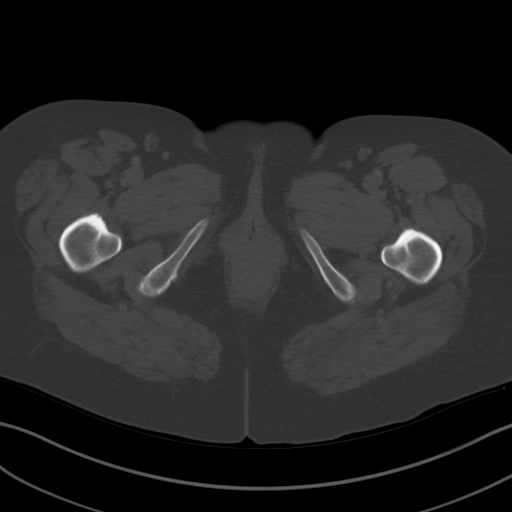
[im 11/91  soft-tissue]
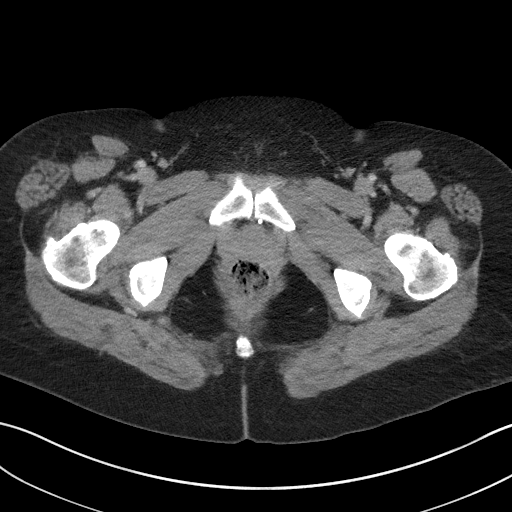
[im 21/91  soft-tissue]
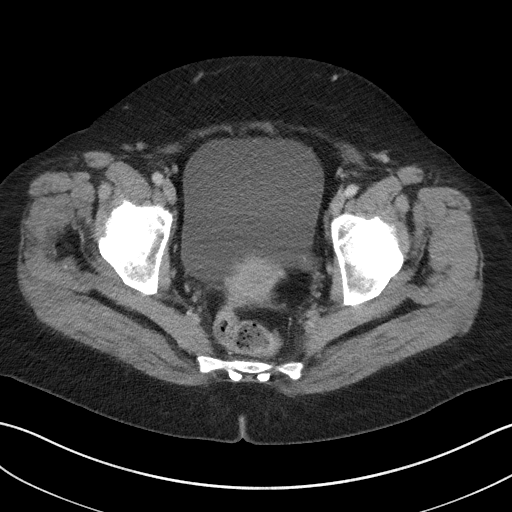
[im 26/91  soft-tissue]
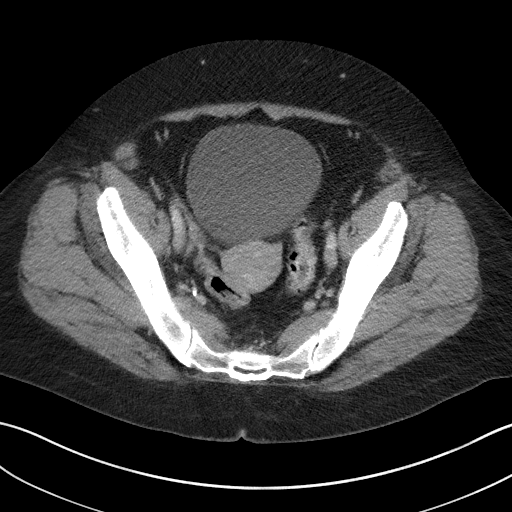
[im 31/91  soft-tissue]
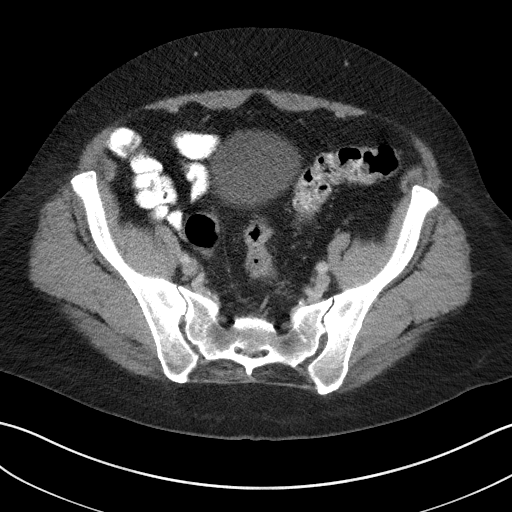
[im 41/91  soft-tissue]
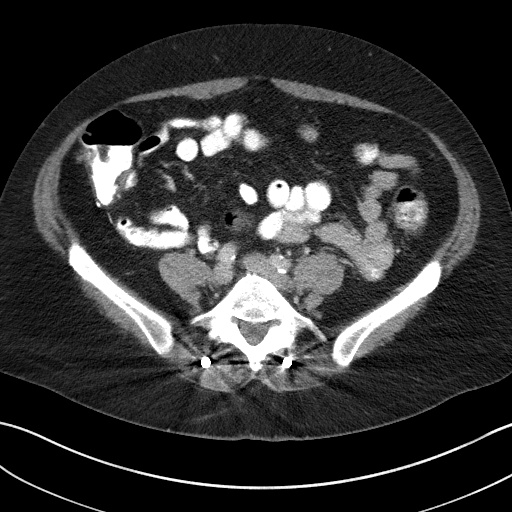
[im 46/91  soft-tissue]
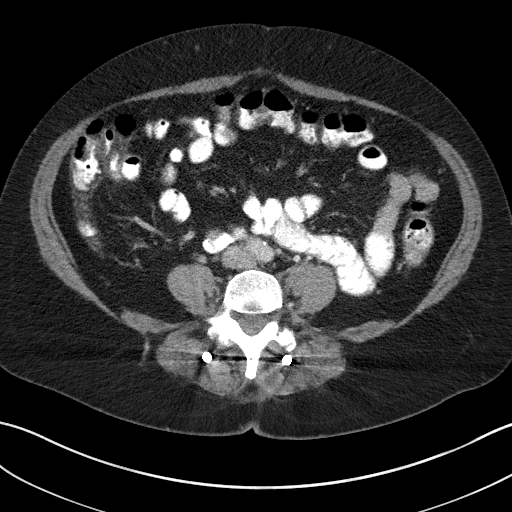
[im 51/91  soft-tissue]
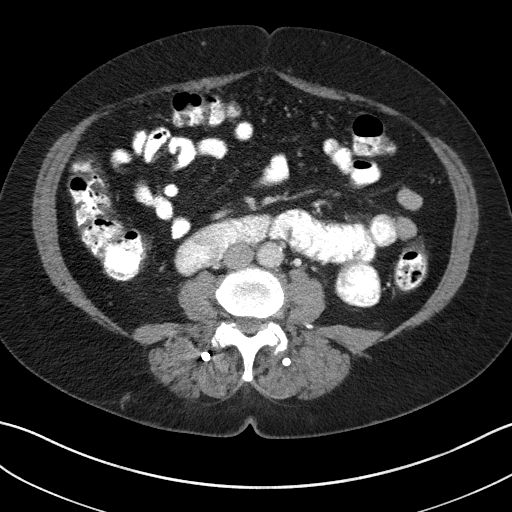
[im 61/91  soft-tissue]
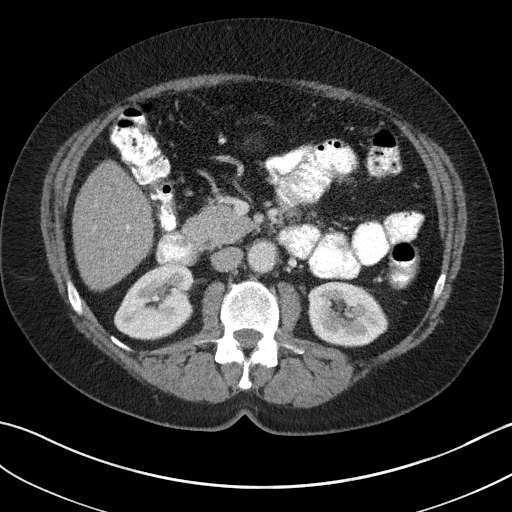
[im 61/91  bone]
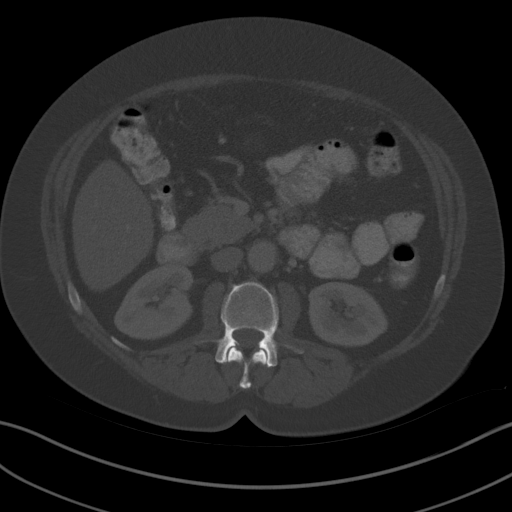
[im 66/91  soft-tissue]
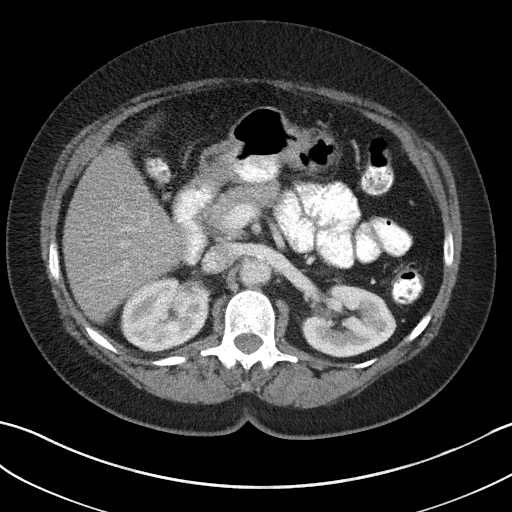
[im 71/91  soft-tissue]
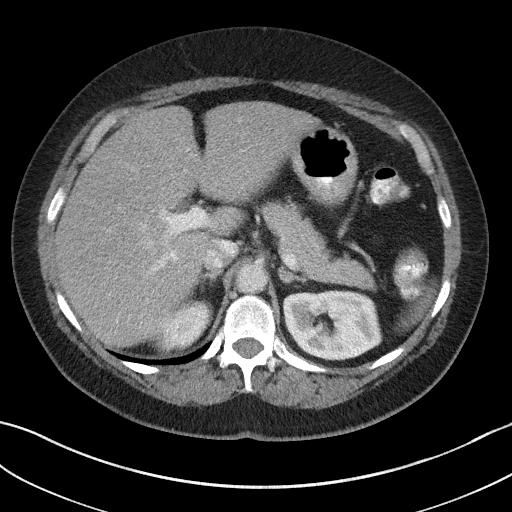
[im 81/91  soft-tissue]
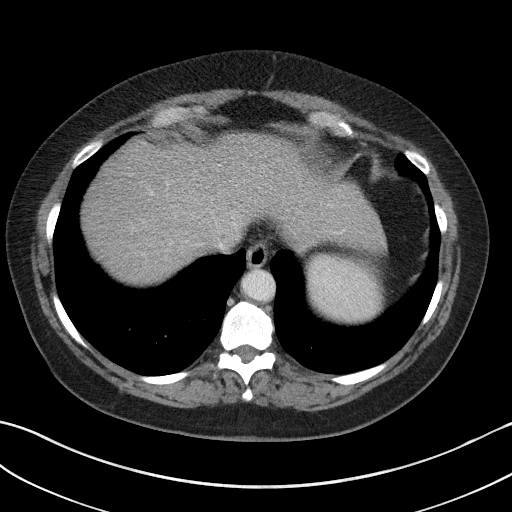
[im 86/91  soft-tissue]
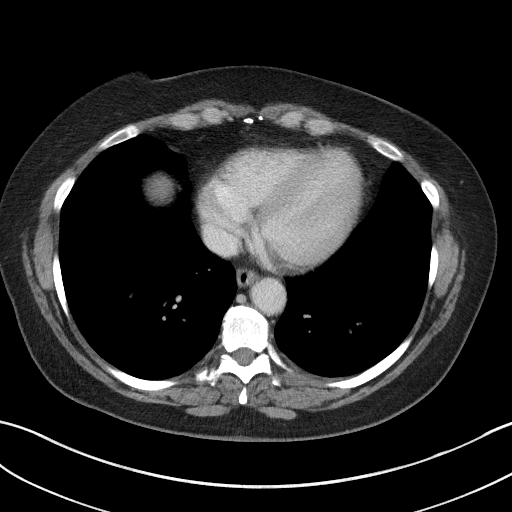

[Series 5: coronal st · coronal · 0.76mm/px · 3 of 97 slices shown]
[im 33/97  soft-tissue]
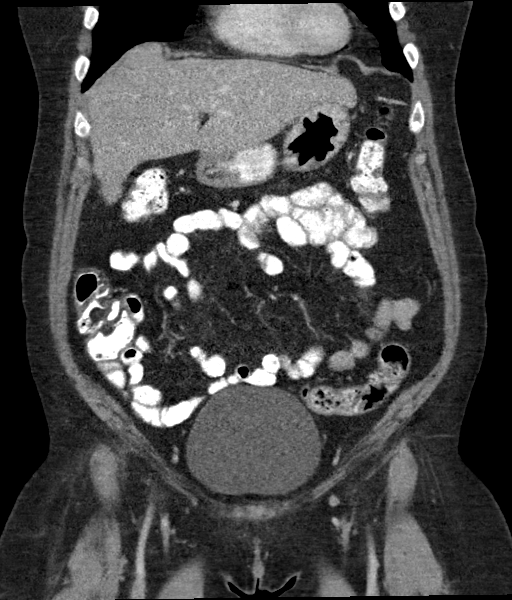
[im 43/97  soft-tissue]
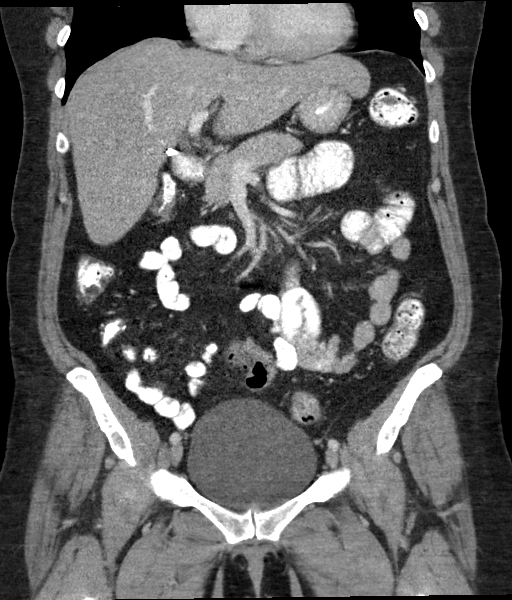
[im 54/97  soft-tissue]
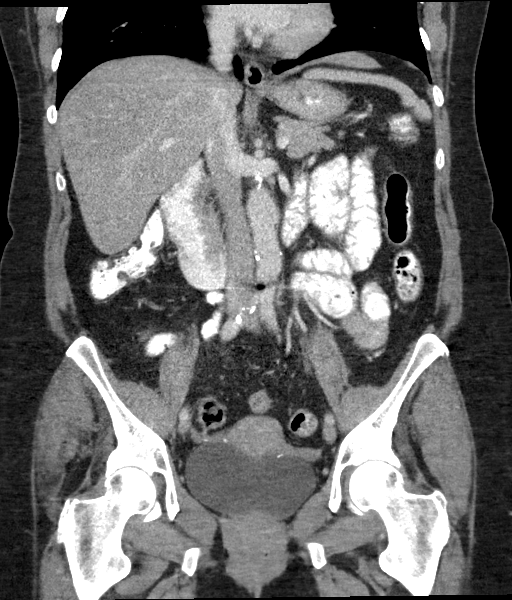

[16 of 46 positions shown; findings below may reference images not displayed]

FINDINGS: Lower chest: No pleural or pericardial effusion. Visualized lung
bases clear.

Hepatobiliary: No focal liver abnormality is seen. Status post
cholecystectomy. No biliary dilatation.

Pancreas: Unremarkable. No pancreatic ductal dilatation or
surrounding inflammatory changes.

Spleen: Normal in size without focal abnormality.

Adrenals/Urinary Tract: Adrenal glands are unremarkable. Kidneys are
normal, without renal calculi, focal lesion, or hydronephrosis.
Bladder is unremarkable.

Stomach/Bowel: The stomach is nondistended. Small bowel is
nondilated. Post appendectomy. The colon is nondilated,
unremarkable.

Vascular/Lymphatic: Mild scattered calcified atheromatous aortoiliac
plaque without aneurysm or stenosis. No abdominal or pelvic
adenopathy.

Reproductive: Uterus and bilateral adnexa are unremarkable.

Other: Right pelvic phleboliths.  No ascites.  No free air.

Musculoskeletal: Changes of instrumented PLIF L4-5 and L5-S1.
Fracture of left L4 and bilateral S1 pedicle screws. There is graft
in the interspaces with mild subsidence, no gas, suggesting
interbody fusion.
IMPRESSION: 1. No acute findings.
2.  Aortic Atherosclerosis ([id number]).
3. Fractured spinal fixation hardware as above.

## 2023-11-01 ENCOUNTER — Encounter: Payer: 59 | Admitting: Physician Assistant

## 2023-11-04 LAB — COMPREHENSIVE METABOLIC PANEL WITH GFR
ALT: 19 IU/L (ref 0–32)
AST: 16 IU/L (ref 0–40)
Albumin: 4.2 g/dL (ref 3.8–4.9)
Alkaline Phosphatase: 126 IU/L — ABNORMAL HIGH (ref 44–121)
BUN/Creatinine Ratio: 20 (ref 12–28)
BUN: 12 mg/dL (ref 8–27)
Bilirubin Total: 0.5 mg/dL (ref 0.0–1.2)
CO2: 24 mmol/L (ref 20–29)
Calcium: 9.3 mg/dL (ref 8.7–10.3)
Chloride: 102 mmol/L (ref 96–106)
Creatinine, Ser: 0.61 mg/dL (ref 0.57–1.00)
Globulin, Total: 2.5 g/dL (ref 1.5–4.5)
Glucose: 97 mg/dL (ref 70–99)
Potassium: 4 mmol/L (ref 3.5–5.2)
Sodium: 141 mmol/L (ref 134–144)
Total Protein: 6.7 g/dL (ref 6.0–8.5)
eGFR: 102 mL/min/1.73 (ref >59)

## 2023-11-04 LAB — LIPID PANEL WITH LDL/HDL RATIO
Cholesterol, Total: 238 mg/dL — ABNORMAL HIGH (ref 100–199)
HDL: 71 mg/dL (ref >39)
LDL Chol Calc (NIH): 152 mg/dL — ABNORMAL HIGH (ref 0–99)
LDL/HDL Ratio: 2.1 ratio (ref 0.0–3.2)
Triglycerides: 85 mg/dL (ref 0–149)
VLDL Cholesterol Cal: 15 mg/dL (ref 5–40)

## 2023-11-04 LAB — CBC WITH DIFFERENTIAL/PLATELET
Basophils Absolute: 0.1 x10E3/uL (ref 0.0–0.2)
Basos: 1 %
EOS (ABSOLUTE): 0.2 x10E3/uL (ref 0.0–0.4)
Eos: 2 %
Hematocrit: 42.2 % (ref 34.0–46.6)
Hemoglobin: 13.9 g/dL (ref 11.1–15.9)
Immature Grans (Abs): 0 x10E3/uL (ref 0.0–0.1)
Immature Granulocytes: 0 %
Lymphocytes Absolute: 1.9 x10E3/uL (ref 0.7–3.1)
Lymphs: 28 %
MCH: 30.2 pg (ref 26.6–33.0)
MCHC: 32.9 g/dL (ref 31.5–35.7)
MCV: 92 fL (ref 79–97)
Monocytes Absolute: 0.7 x10E3/uL (ref 0.1–0.9)
Monocytes: 10 %
Neutrophils Absolute: 4 x10E3/uL (ref 1.4–7.0)
Neutrophils: 59 %
Platelets: 188 x10E3/uL (ref 150–450)
RBC: 4.61 x10E6/uL (ref 3.77–5.28)
RDW: 12.5 % (ref 11.7–15.4)
WBC: 6.7 x10E3/uL (ref 3.4–10.8)

## 2023-11-04 LAB — IRON,TIBC AND FERRITIN PANEL
Ferritin: 92 ng/mL (ref 15–150)
Iron Saturation: 22 % (ref 15–55)
Iron: 71 ug/dL (ref 27–159)
Total Iron Binding Capacity: 324 ug/dL (ref 250–450)
UIBC: 253 ug/dL (ref 131–425)

## 2023-11-04 LAB — B12 AND FOLATE PANEL
Folate: 9.1 ng/mL (ref >3.0)
Vitamin B-12: 428 pg/mL (ref 232–1245)

## 2023-11-04 LAB — VITAMIN D 25 HYDROXY (VIT D DEFICIENCY, FRACTURES): Vit D, 25-Hydroxy: 77.9 ng/mL (ref 30.0–100.0)

## 2023-11-05 ENCOUNTER — Ambulatory Visit (INDEPENDENT_AMBULATORY_CARE_PROVIDER_SITE_OTHER): Admitting: Physician Assistant

## 2023-11-05 ENCOUNTER — Encounter: Payer: Self-pay | Admitting: Physician Assistant

## 2023-11-05 VITALS — BP 128/83 | HR 74 | Temp 98.7°F | Resp 16 | Ht 64.0 in | Wt 177.0 lb

## 2023-11-05 DIAGNOSIS — I5032 Chronic diastolic (congestive) heart failure: Secondary | ICD-10-CM

## 2023-11-05 DIAGNOSIS — E7800 Pure hypercholesterolemia, unspecified: Secondary | ICD-10-CM

## 2023-11-05 DIAGNOSIS — Z0001 Encounter for general adult medical examination with abnormal findings: Secondary | ICD-10-CM | POA: Diagnosis not present

## 2023-11-05 DIAGNOSIS — F411 Generalized anxiety disorder: Secondary | ICD-10-CM

## 2023-11-05 DIAGNOSIS — Z5320 Procedure and treatment not carried out because of patient's decision for unspecified reasons: Secondary | ICD-10-CM

## 2023-11-05 DIAGNOSIS — Z1211 Encounter for screening for malignant neoplasm of colon: Secondary | ICD-10-CM | POA: Diagnosis not present

## 2023-11-05 DIAGNOSIS — Z1212 Encounter for screening for malignant neoplasm of rectum: Secondary | ICD-10-CM

## 2023-11-05 DIAGNOSIS — I10 Essential (primary) hypertension: Secondary | ICD-10-CM | POA: Diagnosis not present

## 2023-11-05 MED ORDER — LOSARTAN POTASSIUM 50 MG PO TABS: 50.0000 mg | ORAL_TABLET | Freq: Every day | ORAL | 1 refills | Status: AC

## 2023-11-05 MED ORDER — FUROSEMIDE 20 MG PO TABS: 20.0000 mg | ORAL_TABLET | Freq: Every day | ORAL | 1 refills | Status: AC

## 2023-11-05 MED ORDER — ALPRAZOLAM 0.25 MG PO TABS: ORAL_TABLET | ORAL | 2 refills | Status: DC

## 2023-11-05 NOTE — Progress Notes (Signed)
 [facility] [address] [place], [place] [address]  Internal MEDICINE  Office Visit Note  Patient Name: Deanna Howard  [id number]  [id number]  Date of Service: 11/30/2023  Chief Complaint  Patient presents with   Annual Exam   Depression   Hyperlipidemia   Quality Metric Gaps    Colonoscopy     HPI Pt is here for routine health maintenance examination -labs reviewed: look good except cholesterol higher-declines meds still and wants to focus on diet and exercise to bring this back down -BP stable: taking propranolol  20 and losartan  50 again. Had been switched to coreg  with cardiology, but did not like how she felt on this and got dizzy so stopped and went back to previous meds. Also takes lasix  prn -GYN appt on Monday for Pap and mammogram -she is established with GI already and will call them to schedule her colonoscopy  Current Medication: Outpatient Encounter Medications as of 11/05/2023  Medication Sig   aspirin-acetaminophen-caffeine (EXCEDRIN MIGRAINE) 250-250-65 MG tablet Take 2 tablets by mouth every 6 (six) hours as needed for headache.   FLUoxetine  (PROZAC ) 10 MG capsule TAKE 1 CAPSULE(10 MG) BY MOUTH DAILY   meloxicam  (MOBIC ) 15 MG tablet Take 1 tablet (15 mg total) by mouth daily.   methimazole (TAPAZOLE) 5 MG tablet Take 5 mg by mouth daily.   ondansetron  (ZOFRAN ) 4 MG tablet Take 1 tablet (4 mg total) by mouth every 8 (eight) hours as needed for nausea or vomiting.   pantoprazole  (PROTONIX ) 40 MG tablet Take 1 tablet (40 mg total) by mouth 2 (two) times daily as needed.   potassium chloride  SA (KLOR-CON  M) 20 MEQ tablet Take 1 tablet (20 mEq total) by mouth daily as needed (for leg swelling).   propranolol  (INDERAL ) 20 MG tablet Take 20 mg by mouth 2 (two) times daily.   sucralfate  (CARAFATE ) 1 g tablet Take 1 tablet (1 g total) by mouth daily as needed. TAKE 1 TABLET(1 GRAM) BY MOUTH DAILY AS NEEDED FOR NAUSEA OR ABDOMINAL PAIN   [DISCONTINUED]  ALPRAZolam  (XANAX ) 0.25 MG tablet TAKE 1/2 TO 1 TABLET BY MOUTH TWICE DAILY AS NEEDED   [DISCONTINUED] furosemide  (LASIX ) 20 MG tablet TAKE 1 TABLET(20 MG) BY MOUTH DAILY   [DISCONTINUED] losartan  (COZAAR ) 50 MG tablet Take 50 mg by mouth daily.   ALPRAZolam  (XANAX ) 0.25 MG tablet TAKE 1/2 TO 1 TABLET BY MOUTH TWICE DAILY AS NEEDED   furosemide  (LASIX ) 20 MG tablet Take 1 tablet (20 mg total) by mouth daily.   losartan  (COZAAR ) 50 MG tablet Take 1 tablet (50 mg total) by mouth daily.   [DISCONTINUED] carvedilol  (COREG ) 3.125 MG tablet Take 1 tablet (3.125 mg total) by mouth 2 (two) times daily with a meal.   [DISCONTINUED] metoprolol  tartrate (LOPRESSOR ) 100 MG tablet TAKE 1 TABLET 2 HR PRIOR TO CARDIAC PROCEDURE   No facility-administered encounter medications on file as of 11/05/2023.    Surgical History: Past Surgical History:  Procedure Laterality Date   APPENDECTOMY     BALLOON DILATION  12/29/2022   Procedure: BALLOON DILATION;  Surgeon: Jinny Carmine, MD;  Location: [facility] ENDOSCOPY;  Service: Endoscopy;;   BIOPSY  12/29/2022   Procedure: BIOPSY;  Surgeon: Jinny Carmine, MD;  Location: [facility] ENDOSCOPY;  Service: Endoscopy;;   CHOLECYSTECTOMY     COLON SURGERY     removed tumor from colon   COLONOSCOPY     ESOPHAGOGASTRODUODENOSCOPY (EGD) WITH PROPOFOL  N/A 12/29/2022   Procedure: ESOPHAGOGASTRODUODENOSCOPY (EGD) WITH PROPOFOL ;  Surgeon: Jinny,  Darren, MD;  Location: [facility] ENDOSCOPY;  Service: Endoscopy;  Laterality: N/A;   HIP SURGERY     cartiledge repair, shaving of the bone.   ROTATOR CUFF REPAIR     SPINAL FUSION     UPPER GASTROINTESTINAL ENDOSCOPY      Medical History: Past Medical History:  Diagnosis Date   CHF (congestive heart failure) (HCC)    mild, PO lasix    Depression    Femoroacetabular impingement of both hips    Graves disease    History of duodenal ulcer    Hyperlipidemia    Hypertension    Osteoarthritis    Thyroid  disease     Family History: Family  History  Problem Relation Age of Onset   Neuropathy Mother    Hypertension Mother    Heart disease Father    Kidney disease Father    Hypertension Father    Heart attack Father    COPD Father    Hypertension Brother    Heart disease Brother    Heart disease Brother    Hypertension Brother    Heart disease Brother    Hypertension Brother       Review of Systems  Constitutional:  Negative for chills, fatigue and unexpected weight change.  HENT:  Negative for congestion, postnasal drip, rhinorrhea, sneezing and sore throat.   Eyes:  Negative for redness.  Respiratory:  Negative for cough, chest tightness and shortness of breath.   Cardiovascular:  Negative for chest pain and palpitations.  Gastrointestinal:  Negative for abdominal pain, constipation, diarrhea, nausea and vomiting.  Genitourinary:  Negative for dysuria and frequency.  Musculoskeletal:  Negative for arthralgias, back pain, joint swelling and neck pain.  Skin:  Negative for rash.  Neurological: Negative.  Negative for tremors and numbness.  Hematological:  Negative for adenopathy. Does not bruise/bleed easily.  Psychiatric/Behavioral:  Negative for behavioral problems (Depression), sleep disturbance and suicidal ideas. The patient is not nervous/anxious.      Vital Signs: BP 128/83   Pulse 74   Temp 98.7 F (37.1 C)   Resp 16   Ht 5' 4 (1.626 m)   Wt 177 lb (80.3 kg)   SpO2 98%   BMI 30.38 kg/m    Physical Exam Vitals and nursing note reviewed.  Constitutional:      General: She is not in acute distress.    Appearance: Normal appearance. She is well-developed. She is not diaphoretic.  HENT:     Head: Normocephalic and atraumatic.  Eyes:     Pupils: Pupils are equal, round, and reactive to light.  Neck:     Thyroid : No thyromegaly.     Vascular: No JVD.     Trachea: No tracheal deviation.  Cardiovascular:     Rate and Rhythm: Normal rate and regular rhythm.     Heart sounds: Normal heart  sounds. No murmur heard.    No friction rub. No gallop.  Pulmonary:     Effort: Pulmonary effort is normal. No respiratory distress.     Breath sounds: No wheezing, rhonchi or rales.  Chest:     Chest wall: No tenderness.  Abdominal:     General: Bowel sounds are normal.     Tenderness: There is no abdominal tenderness.  Musculoskeletal:        General: Normal range of motion.     Cervical back: Normal range of motion and neck supple.     Right lower leg: No edema.     Left lower leg:  No edema.  Lymphadenopathy:     Cervical: No cervical adenopathy.  Skin:    General: Skin is warm and dry.  Neurological:     Mental Status: She is alert and oriented to person, place, and time.     Cranial Nerves: No cranial nerve deficit.  Psychiatric:        Behavior: Behavior normal.        Thought Content: Thought content normal.        Judgment: Judgment normal.      LABS: Recent Results (from the past 2160 hours)  NM PET CT CARDIAC PERFUSION MULTI W/ABSOLUTE BLOODFLOW     Status: None   Collection Time: 09/09/23 12:32 PM  Result Value Ref Range   Rest Nuclear Isotope Dose 20.9 mCi   Stress Nuclear Isotope Dose 20.9 mCi   Rest HR 93.0 bpm   Rest BP 150/73 mmHg   Peak HR 112 bpm   Peak BP 133/67 mmHg   SSS 0.0    SRS 0.0    TID 0.98    Nuc Stress EF 63 %   Nuc Rest EF 55 %   ST Depression (mm) 0 mm   Rest MBF 1.17 ml/g/min   Stress MBF 3.58 ml/g/min   MBFR 3.06   CBC w/Diff/Platelet     Status: None   Collection Time: 11/03/23  7:01 AM  Result Value Ref Range   WBC 6.7 3.4 - 10.8 x10E3/uL   RBC 4.61 3.77 - 5.28 x10E6/uL   Hemoglobin 13.9 11.1 - 15.9 g/dL   Hematocrit 57.7 65.9 - 46.6 %   MCV 92 79 - 97 fL   MCH 30.2 26.6 - 33.0 pg   MCHC 32.9 31.5 - 35.7 g/dL   RDW 87.4 88.2 - 84.5 %   Platelets 188 150 - 450 x10E3/uL   Neutrophils 59 Not Estab. %   Lymphs 28 Not Estab. %   Monocytes 10 Not Estab. %   Eos 2 Not Estab. %   Basos 1 Not Estab. %   Neutrophils Absolute  4.0 1.4 - 7.0 x10E3/uL   Lymphocytes Absolute 1.9 0.7 - 3.1 x10E3/uL   Monocytes Absolute 0.7 0.1 - 0.9 x10E3/uL   EOS (ABSOLUTE) 0.2 0.0 - 0.4 x10E3/uL   Basophils Absolute 0.1 0.0 - 0.2 x10E3/uL   Immature Granulocytes 0 Not Estab. %   Immature Grans (Abs) 0.0 0.0 - 0.1 x10E3/uL  Comprehensive metabolic panel with GFR     Status: Abnormal   Collection Time: 11/03/23  7:01 AM  Result Value Ref Range   Glucose 97 70 - 99 mg/dL   BUN 12 8 - 27 mg/dL   Creatinine, Ser 9.38 0.57 - 1.00 mg/dL   eGFR 897 >40 fO/fpw/8.26   BUN/Creatinine Ratio 20 12 - 28   Sodium 141 134 - 144 mmol/L   Potassium 4.0 3.5 - 5.2 mmol/L   Chloride 102 96 - 106 mmol/L   CO2 24 20 - 29 mmol/L   Calcium 9.3 8.7 - 10.3 mg/dL   Total Protein 6.7 6.0 - 8.5 g/dL   Albumin 4.2 3.8 - 4.9 g/dL   Globulin, Total 2.5 1.5 - 4.5 g/dL   Bilirubin Total 0.5 0.0 - 1.2 mg/dL   Alkaline Phosphatase 126 (H) 44 - 121 IU/L   AST 16 0 - 40 IU/L   ALT 19 0 - 32 IU/L  Lipid Panel With LDL/HDL Ratio     Status: Abnormal   Collection Time: 11/03/23  7:01 AM  Result Value Ref  Range   Cholesterol, Total 238 (H) 100 - 199 mg/dL   Triglycerides 85 0 - 149 mg/dL   HDL 71 >60 mg/dL   VLDL Cholesterol Cal 15 5 - 40 mg/dL   LDL Chol Calc (NIH) 847 (H) 0 - 99 mg/dL   LDL/HDL Ratio 2.1 0.0 - 3.2 ratio    Comment:                                     LDL/HDL Ratio                                             Men  Women                               1/2 Avg.Risk  1.0    1.5                                   Avg.Risk  3.6    3.2                                2X Avg.Risk  6.2    5.0                                3X Avg.Risk  8.0    6.1   B12 and Folate Panel     Status: None   Collection Time: 11/03/23  7:01 AM  Result Value Ref Range   Vitamin B-12 428 232 - 1,245 pg/mL   Folate 9.1 >3.0 ng/mL    Comment: A serum folate concentration of less than 3.1 ng/mL is considered to represent clinical deficiency.   VITAMIN D  25 Hydroxy  (Vit-D Deficiency, Fractures)     Status: None   Collection Time: 11/03/23  7:01 AM  Result Value Ref Range   Vit D, 25-Hydroxy 77.9 30.0 - 100.0 ng/mL    Comment: Vitamin D  deficiency has been defined by the Institute of Medicine and an Endocrine Society practice guideline as a level of serum 25-OH vitamin D  less than 20 ng/mL (1,2). The Endocrine Society went on to further define vitamin D  insufficiency as a level between 21 and 29 ng/mL (2). 1. IOM (Institute of Medicine). 2010. Dietary reference    intakes for calcium and D. [place]: The    [employer]. 2. Holick MF, Binkley [place], Bischoff-Ferrari HA, et al.    Evaluation, treatment, and prevention of vitamin D     deficiency: an Endocrine Society clinical practice    guideline. JCEM. 2011 Jul; 96(7):1911-30.   Fe+TIBC+Fer     Status: None   Collection Time: 11/03/23  7:01 AM  Result Value Ref Range   Total Iron Binding Capacity 324 250 - 450 ug/dL   UIBC 746 868 - 574 ug/dL   Iron 71 27 - 840 ug/dL   Iron Saturation 22 15 - 55 %   Ferritin 92 15 - 150 ng/mL  Cervicovaginal ancillary only     Status: None   Collection Time: 11/08/23  3:56 PM  Result Value Ref Range   Neisseria Gonorrhea Negative    Chlamydia Negative    Trichomonas Negative    Bacterial Vaginitis (gardnerella) Negative    Candida Vaginitis Negative    Candida Glabrata Negative    Comment      Normal Reference Range Bacterial Vaginosis - Negative   Comment Normal Reference Range Candida Species - Negative    Comment Normal Reference Range Candida Galbrata - Negative    Comment Normal Reference Range Trichomonas - Negative    Comment Normal Reference Ranger Chlamydia - Negative    Comment      Normal Reference Range Neisseria Gonorrhea - Negative  Lipase, blood     Status: None   Collection Time: 11/13/23  7:59 AM  Result Value Ref Range   Lipase 26 11 - 51 U/L    Comment: Performed at [facility], [address]  [place], [place] [address]  Comprehensive metabolic panel     Status: Abnormal   Collection Time: 11/13/23  7:59 AM  Result Value Ref Range   Sodium 133 (L) 135 - 145 mmol/L   Potassium 3.0 (L) 3.5 - 5.1 mmol/L   Chloride 96 (L) 98 - 111 mmol/L   CO2 24 22 - 32 mmol/L   Glucose, Bld 99 70 - 99 mg/dL    Comment: Glucose reference range applies only to samples taken after fasting for at least 8 hours.   BUN 11 6 - 20 mg/dL   Creatinine, Ser 9.19 0.44 - 1.00 mg/dL   Calcium 8.6 (L) 8.9 - 10.3 mg/dL   Total Protein 7.0 6.5 - 8.1 g/dL   Albumin 3.5 3.5 - 5.0 g/dL   AST 14 (L) 15 - 41 U/L   ALT 15 0 - 44 U/L   Alkaline Phosphatase 90 38 - 126 U/L   Total Bilirubin 0.9 0.0 - 1.2 mg/dL   GFR, Estimated >39 >39 mL/min    Comment: (NOTE) Calculated using the CKD-EPI Creatinine Equation (2021)    Anion gap 13 5 - 15    Comment: Performed at [facility], [address] [place], [place] [address]  CBC     Status: Abnormal   Collection Time: 11/13/23  7:59 AM  Result Value Ref Range   WBC 8.1 4.0 - 10.5 K/uL   RBC 4.52 3.87 - 5.11 MIL/uL   Hemoglobin 13.7 12.0 - 15.0 g/dL   HCT 59.8 63.9 - 53.9 %   MCV 88.7 80.0 - 100.0 fL   MCH 30.3 26.0 - 34.0 pg   MCHC 34.2 30.0 - 36.0 g/dL   RDW 87.1 88.4 - 84.4 %   Platelets 146 (L) 150 - 400 K/uL   nRBC 0.0 0.0 - 0.2 %    Comment: Performed at [facility], [address] [place], [place] [address]  Urinalysis, Routine w reflex microscopic -Urine, Clean Catch     Status: Abnormal   Collection Time: 11/13/23  7:59 AM  Result Value Ref Range   Color, Urine YELLOW (A) YELLOW   APPearance CLEAR (A) CLEAR   Specific Gravity, Urine 1.017 1.005 - 1.030   pH 6.0 5.0 - 8.0   Glucose, UA NEGATIVE NEGATIVE mg/dL   Hgb urine dipstick NEGATIVE NEGATIVE   Bilirubin Urine NEGATIVE NEGATIVE   Ketones, ur 80 (A) NEGATIVE mg/dL   Protein, ur NEGATIVE NEGATIVE mg/dL   Nitrite NEGATIVE NEGATIVE   Leukocytes,Ua NEGATIVE NEGATIVE    Comment:  Performed at [facility], [address]  [place], [place] [address]  Magnesium     Status: None   Collection Time: 11/13/23  7:59 AM  Result Value Ref Range   Magnesium 1.8 1.7 - 2.4 mg/dL    Comment: Performed at [facility], [address] [place], [place] [address]  Gastrointestinal Panel by PCR , Stool     Status: Abnormal   Collection Time: 11/13/23  9:54 AM   Specimen: Stool  Result Value Ref Range   Campylobacter species DETECTED (A) NOT DETECTED    Comment: RESULT CALLED TO, READ BACK BY AND VERIFIED WITH: ASHLEY MURRY 11/13/23 1147 SLM    Plesimonas shigelloides NOT DETECTED NOT DETECTED   Salmonella species NOT DETECTED NOT DETECTED   Yersinia enterocolitica NOT DETECTED NOT DETECTED   Vibrio species NOT DETECTED NOT DETECTED   Vibrio cholerae NOT DETECTED NOT DETECTED   Enteroaggregative E coli (EAEC) NOT DETECTED NOT DETECTED   Enteropathogenic E coli (EPEC) NOT DETECTED NOT DETECTED   Enterotoxigenic E coli (ETEC) NOT DETECTED NOT DETECTED   Shiga like toxin producing E coli (STEC) NOT DETECTED NOT DETECTED   Shigella/Enteroinvasive E coli (EIEC) NOT DETECTED NOT DETECTED   Cryptosporidium NOT DETECTED NOT DETECTED   Cyclospora cayetanensis NOT DETECTED NOT DETECTED   Entamoeba histolytica NOT DETECTED NOT DETECTED   Giardia lamblia NOT DETECTED NOT DETECTED   Adenovirus F40/41 NOT DETECTED NOT DETECTED   Astrovirus NOT DETECTED NOT DETECTED   Norovirus GI/GII NOT DETECTED NOT DETECTED   Rotavirus A NOT DETECTED NOT DETECTED   Sapovirus (I, II, IV, and V) NOT DETECTED NOT DETECTED    Comment: Performed at [facility], [address] [place], [place] [address]  C Difficile Quick Screen w PCR reflex     Status: None   Collection Time: 11/13/23  9:54 AM   Specimen: Stool  Result Value Ref Range   C Diff antigen NEGATIVE NEGATIVE   C Diff toxin NEGATIVE NEGATIVE   C Diff interpretation No C. difficile detected.     Comment:  Performed at [facility], [address], [place], [place] [address]        Assessment/Plan: 1. Encounter for general adult medical examination with abnormal findings (Primary) CPE performed, labs reviewed, due for colon screening, Gyn for pap and mammogram  2. GAD (generalized anxiety disorder) May continue prozac  daily and xanax  prn - ALPRAZolam  (XANAX ) 0.25 MG tablet; TAKE 1/2 TO 1 TABLET BY MOUTH TWICE DAILY AS NEEDED  Dispense: 30 tablet; Refill: 2  3. Benign hypertension Stable, continue current medication - furosemide  (LASIX ) 20 MG tablet; Take 1 tablet (20 mg total) by mouth daily.  Dispense: 90 tablet; Refill: 1 - losartan  (COZAAR ) 50 MG tablet; Take 1 tablet (50 mg total) by mouth daily.  Dispense: 90 tablet; Refill: 1  4. Chronic diastolic CHF (congestive heart failure) (HCC) Followed by cardiology  5. Screening for colorectal cancer Will schedule colonoscopy with GI  6. Pure hypercholesterolemia Will work on diet and exercise, declines medications  7. Statin declined Declines medication and will work on lifestyle changes   General Counseling: spring [facility] understanding of the findings of todays visit and agrees with plan of treatment. I have discussed any further diagnostic evaluation that may be needed or ordered today. We also reviewed her medications today. she has been encouraged to call the office with any questions or concerns that should arise related to todays visit.    Counseling:    No orders of the defined types were placed in  this encounter.   Meds ordered this encounter  Medications   furosemide  (LASIX ) 20 MG tablet    Sig: Take 1 tablet (20 mg total) by mouth daily.    Dispense:  90 tablet    Refill:  1   losartan  (COZAAR ) 50 MG tablet    Sig: Take 1 tablet (50 mg total) by mouth daily.    Dispense:  90 tablet    Refill:  1   ALPRAZolam  (XANAX ) 0.25 MG tablet    Sig: TAKE 1/2 TO 1 TABLET BY MOUTH TWICE DAILY AS NEEDED     Dispense:  30 tablet    Refill:  2    This patient was seen by Tinnie Pro, PA-C in collaboration with Dr. Sigrid Bathe as a part of collaborative care agreement.  Total time spent:35 Minutes  Time spent includes review of chart, medications, test results, and follow up plan with the patient.     Sigrid CHRISTELLA Bathe, MD  Internal Medicine

## 2023-11-08 ENCOUNTER — Ambulatory Visit (INDEPENDENT_AMBULATORY_CARE_PROVIDER_SITE_OTHER): Admitting: Certified Nurse Midwife

## 2023-11-08 ENCOUNTER — Encounter: Payer: Self-pay | Admitting: Certified Nurse Midwife

## 2023-11-08 ENCOUNTER — Other Ambulatory Visit (HOSPITAL_COMMUNITY)
Admission: RE | Admit: 2023-11-08 | Discharge: 2023-11-08 | Disposition: A | Discharge status: Home / Self Care | Source: Ambulatory Visit | Attending: Certified Nurse Midwife | Admitting: Certified Nurse Midwife

## 2023-11-08 VITALS — BP 151/84 | HR 105 | Ht 64.0 in | Wt 174.7 lb

## 2023-11-08 DIAGNOSIS — N941 Unspecified dyspareunia: Secondary | ICD-10-CM | POA: Insufficient documentation

## 2023-11-08 MED ORDER — IMVEXXY MAINTENANCE PACK 4 MCG VA INST: 1.0000 | VAGINAL_INSERT | VAGINAL | 3 refills | Status: AC

## 2023-11-08 MED ORDER — IMVEXXY STARTER PACK 4 MCG VA INST: 1.0000 | VAGINAL_INSERT | Freq: Every day | VAGINAL | 0 refills | Status: AC

## 2023-11-08 NOTE — Progress Notes (Signed)
 GYN ENCOUNTER NOTE  Subjective:       Deanna Howard is a 58 y.o. G59P2002 female is here for gynecologic evaluation of the following issues:  1. Dyspareunia, pt state she has experienced pain with intercourse for the past 6-8 months. She describes it as occurring vaginally with penetration. She notes it is more of a dryness. She has used lubrication ( K-y) that does not help. She denies abnormal discharge, itching, burning or odor.    Gynecologic History No LMP recorded. Patient is postmenopausal. Contraception: none Last Pap: 08/21/2020. Results were: normal Last mammogram: 12/22/2022. Results were: normal  Obstetric History OB History  Gravida Para Term Preterm AB Living  2 2 2  0 0 2  SAB IAB Ectopic Multiple Live Births  0 0 0 0 0    # Outcome Date GA Lbr Len/2nd Weight Sex Type Anes PTL Lv  2 Term           1 Term             Past Medical History:  Diagnosis Date   Depression    Femoroacetabular impingement of both hips    Graves disease    History of duodenal ulcer    Hyperlipidemia    Osteoarthritis    Thyroid  disease     Past Surgical History:  Procedure Laterality Date   APPENDECTOMY     BALLOON DILATION  12/29/2022   Procedure: BALLOON DILATION;  Surgeon: Jinny Carmine, MD;  Location: [facility]COPY;  Service: Endoscopy;;   BIOPSY  12/29/2022   Procedure: BIOPSY;  Surgeon: Jinny Carmine, MD;  Location: [facility] ENDOSCOPY;  Service: Endoscopy;;   CHOLECYSTECTOMY     COLON SURGERY     removed tumor from colon   COLONOSCOPY     ESOPHAGOGASTRODUODENOSCOPY (EGD) WITH PROPOFOL  N/A 12/29/2022   Procedure: ESOPHAGOGASTRODUODENOSCOPY (EGD) WITH PROPOFOL ;  Surgeon: Jinny Carmine, MD;  Location: [facility] ENDOSCOPY;  Service: Endoscopy;  Laterality: N/A;   HIP SURGERY     cartiledge repair, shaving of the bone.   ROTATOR CUFF REPAIR     SPINAL FUSION     UPPER GASTROINTESTINAL ENDOSCOPY      Current Outpatient Medications on File Prior to Visit  Medication Sig Dispense Refill    ALPRAZolam  (XANAX ) 0.25 MG tablet TAKE 1/2 TO 1 TABLET BY MOUTH TWICE DAILY AS NEEDED 30 tablet 2   aspirin-acetaminophen-caffeine (EXCEDRIN MIGRAINE) 250-250-65 MG tablet Take 2 tablets by mouth every 6 (six) hours as needed for headache.     FLUoxetine  (PROZAC ) 10 MG capsule TAKE 1 CAPSULE(10 MG) BY MOUTH DAILY 90 capsule 1   furosemide  (LASIX ) 20 MG tablet Take 1 tablet (20 mg total) by mouth daily. 90 tablet 1   losartan  (COZAAR ) 50 MG tablet Take 1 tablet (50 mg total) by mouth daily. 90 tablet 1   meloxicam  (MOBIC ) 15 MG tablet Take 1 tablet (15 mg total) by mouth daily. 30 tablet 3   methimazole (TAPAZOLE) 5 MG tablet Take 5 mg by mouth daily.     ondansetron  (ZOFRAN ) 4 MG tablet Take 1 tablet (4 mg total) by mouth every 8 (eight) hours as needed for nausea or vomiting. 20 tablet 0   pantoprazole  (PROTONIX ) 40 MG tablet Take 1 tablet (40 mg total) by mouth 2 (two) times daily as needed. 180 tablet 3   potassium chloride  SA (KLOR-CON  M) 20 MEQ tablet Take 1 tablet (20 mEq total) by mouth daily as needed (for leg swelling). 30 tablet 3   propranolol  (INDERAL ) 20 MG  tablet Take 20 mg by mouth 2 (two) times daily.     sucralfate  (CARAFATE ) 1 g tablet Take 1 tablet (1 g total) by mouth daily as needed. TAKE 1 TABLET(1 GRAM) BY MOUTH DAILY AS NEEDED FOR NAUSEA OR ABDOMINAL PAIN 90 tablet 0   No current facility-administered medications on file prior to visit.    No Known Allergies  Social History   Socioeconomic History   Marital status: Married    Spouse name: Not on file   Number of children: Not on file   Years of education: Not on file   Highest education level: Not on file  Occupational History   Not on file  Tobacco Use   Smoking status: Every Day    Current packs/day: 1.00    Average packs/day: 1 pack/day for 30.0 years (30.0 ttl pk-yrs)    Types: Cigarettes   Smokeless tobacco: Never   Tobacco comments:    Smokes 1/2 pack daily  Vaping Use   Vaping status: Never Used   Substance and Sexual Activity   Alcohol use: Yes    Alcohol/week: 28.0 standard drinks of alcohol    Types: 28 Cans of beer per week    Comment: States she has 3 to 4 white claw per night   Drug use: Never   Sexual activity: Not on file  Other Topics Concern   Not on file  Social History Narrative   Not on file   Social Drivers of Health   Financial Resource Strain: Not on file  Food Insecurity: Not on file  Transportation Needs: Not on file  Physical Activity: Not on file  Stress: Not on file  Social Connections: Not on file  Intimate Partner Violence: Not on file    Family History  Problem Relation Age of Onset   Neuropathy Mother    Hypertension Mother    Heart disease Father    Kidney disease Father    Hypertension Father    Heart attack Father    COPD Father    Hypertension Brother    Heart disease Brother    Heart disease Brother    Hypertension Brother    Heart disease Brother    Hypertension Brother     The following portions of the patient's history were reviewed and updated as appropriate: allergies, current medications, past family history, past medical history, past social history, past surgical history and problem list.  Review of Systems Review of Systems - Negative except as mentioned in HPI Review of Systems - General ROS: negative for - chills, fatigue, fever, hot flashes, malaise or night sweats Hematological and Lymphatic ROS: negative for - bleeding problems or swollen lymph nodes Gastrointestinal ROS: negative for - abdominal pain, blood in stools, change in bowel habits and nausea/vomiting Musculoskeletal ROS: negative for - joint pain, muscle pain or muscular weakness Genito-Urinary ROS: negative for - change in menstrual cycle, dysmenorrhea, dyspareunia, dysuria, genital discharge, genital ulcers, hematuria, incontinence, irregular/heavy menses, nocturia or pelvic pain. Positive for dyspareunia   Objective:   BP (!) 151/84   Pulse (!) 105    Ht 5' 4 (1.626 m)   Wt 174 lb 11.2 oz (79.2 kg)   BMI 29.99 kg/m  CONSTITUTIONAL: Well-developed, well-nourished female in no acute distress.  HENT:  Normocephalic, atraumatic.  NECK: Normal range of motion, supple, no masses.  Normal thyroid .  SKIN: Skin is warm and dry. No rash noted. Not diaphoretic. No erythema. No pallor. NEUROLGIC: Alert and oriented to person, place, and time.  PSYCHIATRIC: Normal mood and affect. Normal behavior. Normal judgment and thought content. CARDIOVASCULAR:Not Examined RESPIRATORY: Not Examined BREASTS: Not Examined ABDOMEN: Soft, non distended; Non tender.  No Organomegaly. PELVIC:  External Genitalia: Normal  BUS: Normal, no pain with exam   Vagina: Normal, normal atrophic changes, no odor or abnormal discharge noted , no pain noted on internal exam   Cervix: Normal, no pain with exam    MUSCULOSKELETAL: Normal range of motion. No tenderness.  No cyanosis, clubbing, or edema.     Assessment:   Dyspareunia    Plan:   Discussed normal postmenopausal changes Vaginal swab collected to r/o infection.  Recommend use of better lubricant. Sample of uber lube given. Discussed vaginal estrogen to improve tissue integrity. . She denies any contraindication to use. She is in agreement to plan. Orders placed for Imvexxy . She will take 1 soft gel vaginally daily for 2 weeks . Then 1 soft gel vaginally twice weekly. I will follow up with swab results.   Zelda Hummer, CNM

## 2023-11-10 LAB — CERVICOVAGINAL ANCILLARY ONLY
Bacterial Vaginitis (gardnerella): NEGATIVE
Candida Glabrata: NEGATIVE
Candida Vaginitis: NEGATIVE
Chlamydia: NEGATIVE
Comment: NEGATIVE
Comment: NEGATIVE
Comment: NEGATIVE
Comment: NEGATIVE
Comment: NEGATIVE
Comment: NORMAL
Neisseria Gonorrhea: NEGATIVE
Trichomonas: NEGATIVE

## 2023-11-13 ENCOUNTER — Other Ambulatory Visit: Payer: Self-pay

## 2023-11-13 ENCOUNTER — Emergency Department

## 2023-11-13 ENCOUNTER — Emergency Department
Admission: EM | Admit: 2023-11-13 | Discharge: 2023-11-13 | Disposition: A | Discharge status: Home / Self Care | Attending: Emergency Medicine | Admitting: Emergency Medicine

## 2023-11-13 DIAGNOSIS — A045 Campylobacter enteritis: Secondary | ICD-10-CM | POA: Diagnosis not present

## 2023-11-13 DIAGNOSIS — R1031 Right lower quadrant pain: Secondary | ICD-10-CM | POA: Insufficient documentation

## 2023-11-13 DIAGNOSIS — R112 Nausea with vomiting, unspecified: Secondary | ICD-10-CM

## 2023-11-13 DIAGNOSIS — E876 Hypokalemia: Secondary | ICD-10-CM | POA: Insufficient documentation

## 2023-11-13 DIAGNOSIS — I119 Hypertensive heart disease without heart failure: Secondary | ICD-10-CM | POA: Insufficient documentation

## 2023-11-13 DIAGNOSIS — Z720 Tobacco use: Secondary | ICD-10-CM | POA: Diagnosis not present

## 2023-11-13 HISTORY — DX: Essential (primary) hypertension: I10

## 2023-11-13 HISTORY — DX: Heart failure, unspecified: I50.9

## 2023-11-13 LAB — COMPREHENSIVE METABOLIC PANEL WITH GFR
ALT: 15 U/L (ref 0–44)
AST: 14 U/L — ABNORMAL LOW (ref 15–41)
Albumin: 3.5 g/dL (ref 3.5–5.0)
Alkaline Phosphatase: 90 U/L (ref 38–126)
Anion gap: 13 (ref 5–15)
BUN: 11 mg/dL (ref 6–20)
CO2: 24 mmol/L (ref 22–32)
Calcium: 8.6 mg/dL — ABNORMAL LOW (ref 8.9–10.3)
Chloride: 96 mmol/L — ABNORMAL LOW (ref 98–111)
Creatinine, Ser: 0.8 mg/dL (ref 0.44–1.00)
GFR, Estimated: >60 mL/min (ref >60)
Glucose, Bld: 99 mg/dL (ref 70–99)
Potassium: 3 mmol/L — ABNORMAL LOW (ref 3.5–5.1)
Sodium: 133 mmol/L — ABNORMAL LOW (ref 135–145)
Total Bilirubin: 0.9 mg/dL (ref 0.0–1.2)
Total Protein: 7 g/dL (ref 6.5–8.1)

## 2023-11-13 LAB — URINALYSIS, ROUTINE W REFLEX MICROSCOPIC
Bilirubin Urine: NEGATIVE
Glucose, UA: NEGATIVE mg/dL
Hgb urine dipstick: NEGATIVE
Ketones, ur: 80 mg/dL — AB
Leukocytes,Ua: NEGATIVE
Nitrite: NEGATIVE
Protein, ur: NEGATIVE mg/dL
Specific Gravity, Urine: 1.017 (ref 1.005–1.030)
pH: 6 (ref 5.0–8.0)

## 2023-11-13 LAB — GASTROINTESTINAL PANEL BY PCR, STOOL (REPLACES STOOL CULTURE)

## 2023-11-13 LAB — C DIFFICILE QUICK SCREEN W PCR REFLEX
C Diff antigen: NEGATIVE
C Diff interpretation: NOT DETECTED
C Diff toxin: NEGATIVE

## 2023-11-13 LAB — CBC
HCT: 40.1 % (ref 36.0–46.0)
Hemoglobin: 13.7 g/dL (ref 12.0–15.0)
MCH: 30.3 pg (ref 26.0–34.0)
MCHC: 34.2 g/dL (ref 30.0–36.0)
MCV: 88.7 fL (ref 80.0–100.0)
Platelets: 146 K/uL — ABNORMAL LOW (ref 150–400)
RBC: 4.52 MIL/uL (ref 3.87–5.11)
RDW: 12.8 % (ref 11.5–15.5)
WBC: 8.1 K/uL (ref 4.0–10.5)
nRBC: 0 % (ref 0.0–0.2)

## 2023-11-13 LAB — LIPASE, BLOOD: Lipase: 26 U/L (ref 11–51)

## 2023-11-13 LAB — MAGNESIUM: Magnesium: 1.8 mg/dL (ref 1.7–2.4)

## 2023-11-13 MED ORDER — ONDANSETRON HCL 4 MG/2ML IJ SOLN
4.0000 mg | Freq: Once | INTRAMUSCULAR | Status: AC
  Administered 2023-11-13: 4 mg via INTRAVENOUS
  Filled 2023-11-13: qty 2

## 2023-11-13 MED ORDER — MORPHINE SULFATE (PF) 2 MG/ML IV SOLN
2.0000 mg | Freq: Once | INTRAVENOUS | Status: AC
  Administered 2023-11-13: 2 mg via INTRAVENOUS
  Filled 2023-11-13: qty 1

## 2023-11-13 MED ORDER — IOHEXOL 300 MG/ML  SOLN
80.0000 mL | Freq: Once | INTRAMUSCULAR | Status: AC | PRN
  Administered 2023-11-13: 80 mL via INTRAVENOUS

## 2023-11-13 MED ORDER — KETOROLAC TROMETHAMINE 15 MG/ML IJ SOLN
15.0000 mg | Freq: Once | INTRAMUSCULAR | Status: AC
  Administered 2023-11-13: 15 mg via INTRAVENOUS
  Filled 2023-11-13: qty 1

## 2023-11-13 MED ORDER — POTASSIUM CHLORIDE 20 MEQ PO PACK
40.0000 meq | PACK | Freq: Once | ORAL | Status: AC
  Administered 2023-11-13: 40 meq via ORAL
  Filled 2023-11-13: qty 2

## 2023-11-13 MED ORDER — SODIUM CHLORIDE 0.9 % IV BOLUS
1000.0000 mL | Freq: Once | INTRAVENOUS | Status: AC
  Administered 2023-11-13: 1000 mL via INTRAVENOUS

## 2023-11-13 MED ORDER — AZITHROMYCIN 500 MG PO TABS: 500.0000 mg | ORAL_TABLET | Freq: Two times a day (BID) | ORAL | 0 refills | Status: AC

## 2023-11-13 MED ORDER — ONDANSETRON 4 MG PO TBDP
4.0000 mg | ORAL_TABLET | Freq: Three times a day (TID) | ORAL | 0 refills | Status: AC | PRN
Start: 2023-11-13 — End: ?

## 2023-11-13 NOTE — ED Provider Notes (Addendum)
 [facility] Provider Note    Event Date/Time   First MD Initiated Contact with Patient 11/13/23 [phone]     (approximate)   History   Diarrhea and Abdominal Pain   HPI  Deanna Howard is a 58 y.o. female past medical history significant for Graves' disease, HFpEF, hypertension, tobacco use, who presents to the emergency department for the diarrhea and abdominal pain.  States that on Wednesday night she started having diarrhea and nausea.  Lower abdominal pain to the bilateral lower abdomen that is slightly worse on the right side.  Multiple episodes of diarrhea that she states she had approximately 6-8 episodes last night that has been nonbloody.  No recent antibiotic use or travel.  No fever or chills.  Took home Zofran  with only minimal improvement of her nausea.  No chest pain or shortness of breath.  No significant cough.  No recent changes to her home medications.  Prior appendectomy and cholecystectomy.  No prior history of diverticulitis.     Physical Exam   Triage Vital Signs: ED Triage Vitals  Encounter Vitals Group     BP 11/13/23 0758 134/72     Girls Systolic BP Percentile --      Girls Diastolic BP Percentile --      Boys Systolic BP Percentile --      Boys Diastolic BP Percentile --      Pulse Rate 11/13/23 0758 (!) 101     Resp 11/13/23 0758 18     Temp 11/13/23 0758 98.2 F (36.8 C)     Temp Source 11/13/23 0758 Oral     SpO2 11/13/23 0758 95 %     Weight 11/13/23 0802 174 lb (78.9 kg)     Height 11/13/23 0802 5' 4 (1.626 m)     Head Circumference --      Peak Flow --      Pain Score 11/13/23 0800 5     Pain Loc --      Pain Education --      Exclude from Growth Chart --     Most recent vital signs: Vitals:   11/13/23 1100 11/13/23 1118  BP: 131/73 137/75  Pulse: 86 82  Resp: 10 15  Temp:    SpO2: 99% 95%    Physical Exam Constitutional:      Appearance: She is well-developed.  HENT:     Head: Atraumatic.  Eyes:      Conjunctiva/sclera: Conjunctivae normal.  Cardiovascular:     Rate and Rhythm: Regular rhythm. Tachycardia present.  Pulmonary:     Effort: No respiratory distress.  Abdominal:     General: There is no distension.     Tenderness: There is abdominal tenderness in the right lower quadrant and left lower quadrant. There is no guarding or rebound. Negative signs include Murphy's sign.  Musculoskeletal:        General: Normal range of motion.     Cervical back: Normal range of motion.  Skin:    General: Skin is warm.     Capillary Refill: Capillary refill takes 2 to 3 seconds.  Neurological:     General: No focal deficit present.     Mental Status: She is alert. Mental status is at baseline.     IMPRESSION / MDM / ASSESSMENT AND PLAN / ED COURSE  I reviewed the triage vital signs and the nursing notes.  Differential diagnosis including viral gastroenteritis, diverticulitis, intra-abdominal abscess, SBO, C. difficile, electrolyte abnormality, dehydration  Plan for IV fluids, antiemetics  EKG  I, Clotilda Punter, the attending physician, personally viewed and interpreted this ECG.   Rate: Normal  Rhythm: Normal sinus  Axis: Right  Intervals: Normal  ST&T Change: None  No tachycardic or bradycardic dysrhythmias while on cardiac telemetry.  RADIOLOGY CT scan abdomen and pelvis with contrast -modest segmental fat stranding throughout the cecum and ascending colon concerning for inflammatory infectious colitis.  Simple left ovarian cyst and no recommended follow-up imaging needed.  LABS (all labs ordered are listed, but only abnormal results are displayed) Labs interpreted as -    Labs Reviewed  GASTROINTESTINAL PANEL BY PCR, STOOL (REPLACES STOOL CULTURE) - Abnormal; Notable for the following components:      Result Value   Campylobacter species DETECTED (*)    All other components within normal limits  COMPREHENSIVE METABOLIC PANEL WITH GFR - Abnormal; Notable for the  following components:   Sodium 133 (*)    Potassium 3.0 (*)    Chloride 96 (*)    Calcium 8.6 (*)    AST 14 (*)    All other components within normal limits  CBC - Abnormal; Notable for the following components:   Platelets 146 (*)    All other components within normal limits  URINALYSIS, ROUTINE W REFLEX MICROSCOPIC - Abnormal; Notable for the following components:   Color, Urine YELLOW (*)    APPearance CLEAR (*)    Ketones, ur 80 (*)    All other components within normal limits  C DIFFICILE QUICK SCREEN W PCR REFLEX    LIPASE, BLOOD  MAGNESIUM     MDM  Given IV fluids and antiemetics and Toradol  for pain control  Mild hyponatremia of 133, potassium low at 3.0 and will add on a magnesium level.  Platelets mildly low at 146 and has normal platelets at baseline.  No signs of urinary tract infection.  No signs of anemia or leukocytosis.  Given oral potassium replacement.  Tolerating p.o. and able to keep down her potassium.  Magnesium level within normal limits.  CT scan with findings of colitis.  C. difficile was negative.  Tolerating p.o.  Did have findings of dehydration on her lab work and urine with ketones but no signs of a urinary tract infection.  Discussed good oral hydration, symptomatic treatment, replacement of potassium and given antiemetics.  Discussed that I would notify if any abnormality findings on GI pathogen panel.  11:58 AM GI pathogen panel resulted positive for Campylobacter, called and discussed this results with the patient.  Discussed supportive care.  Called in azithromycin  if needed and having ongoing symptoms in the next couple of days.  Discussed return precautions for any signs of dehydration or worsening symptoms     PROCEDURES:  Critical Care performed: No  Procedures  Patient's presentation is most consistent with acute presentation with potential threat to life or bodily function.   MEDICATIONS ORDERED IN ED: Medications  sodium  chloride 0.9 % bolus 1,000 mL (0 mLs Intravenous Stopped 11/13/23 1051)  ondansetron  (ZOFRAN ) injection 4 mg (4 mg Intravenous Given 11/13/23 0917)  potassium chloride  (KLOR-CON ) packet 40 mEq (40 mEq Oral Given 11/13/23 0918)  ketorolac  (TORADOL ) 15 MG/ML injection 15 mg (15 mg Intravenous Given 11/13/23 0917)  iohexol  (OMNIPAQUE ) 300 MG/ML solution 80 mL (80 mLs Intravenous Contrast Given 11/13/23 0929)  morphine  (PF) 2 MG/ML injection 2 mg (2 mg Intravenous Given 11/13/23 1050)    FINAL CLINICAL IMPRESSION(S) / ED DIAGNOSES   Final diagnoses:  Nausea vomiting and diarrhea  Hypokalemia  Campylobacter diarrhea     Rx / DC Orders   ED Discharge Orders          Ordered    ondansetron  (ZOFRAN -ODT) 4 MG disintegrating tablet  Every 8 hours PRN        11/13/23 1058    azithromycin  (ZITHROMAX ) 500 MG tablet  2 times daily        11/13/23 1158             Note:  This document was prepared using Dragon voice recognition software and may include unintentional dictation errors.   Suzanne Kirsch, MD 11/13/23 1105    Suzanne Kirsch, MD 11/13/23 1158

## 2023-11-13 NOTE — Discharge Instructions (Addendum)
 You were seen in the emergency department for nausea and diarrhea.  You had a CT scan that showed findings of colitis.  You had a C. difficile test that was negative.  You had lab work that showed dehydration and low potassium level.  You had a GI pathogen panel that had not resulted at time of discharge, I will call you if there is any abnormalities on your GI pathogen panel to result.  It is important that you stay hydrated and drink plenty of fluids.  Try Pedialyte or some fluids that have extra electrolytes in it.  Avoid Imodium if you are able.  You are given a prescription for an nausea medication.  Follow-up closely with your primary care physician for reevaluation and to check electrolytes.  Return to the emergency department for any worsening symptoms or ongoing symptoms  Your potassium was mildly low when checked today.  Make sure to follow up with a primary doctor to follow up your labs.  Make sure to eat food high in potassium and magnesium - examples - potatoes, spinach, bananas, beans, avocadoes, oranges, nuts.  Pain control:  Ibuprofen (motrin/aleve/advil) - You can take 3 tablets (600 mg) every 6 hours as needed for pain/fever.  Acetaminophen (tylenol) - You can take 2 extra strength tablets (1000 mg) every 6 hours as needed for pain/fever.  You can alternate these medications or take them together.  Make sure you eat food/drink water when taking these medications.

## 2023-11-13 NOTE — ED Notes (Signed)
 Called lab to add-on magnesium level

## 2023-11-13 NOTE — ED Triage Notes (Signed)
 Pt to ED for nausea and diarrhea since 2 days. Also RLQ abdominal pain, does not have appendix. Pain is dull, 5/10. 10 episodes of explosive diarrhea last night.

## 2023-11-18 ENCOUNTER — Ambulatory Visit: Payer: 59 | Admitting: Dermatology

## 2023-11-30 LAB — MISCELLANEOUS TEST

## 2023-12-08 ENCOUNTER — Ambulatory Visit: Payer: Self-pay | Admitting: Physician Assistant

## 2023-12-27 NOTE — Progress Notes (Deleted)
 Cardiology Clinic Note   Date: 12/27/2023 ID: Deanna Howard, DOB 1965-12-31, MRN [id number]  Primary Cardiologist:  Evalene Lunger, MD  Chief Complaint   Deanna Howard is a 58 y.o. female who presents to the clinic today for ***  Patient Profile   Deanna Howard is followed by Dr. Gollan for the history outlined below.      Past medical history significant for: Chronic diastolic heart failure. Echo 08/27/2023: EF 60 to 65%.  Regional wall motion unable to be evaluated.  Mild LVH.  Grade I DD.  Normal RV function.  Mild RVH.  Normal PA pressure.  Mild LAE.  Trivial MR.  Mild to moderate AI.  Cannot exclude small PFO. Dyspnea. Cardiac PET stress 09/09/2023: Normal, low risk study.  No evidence of ischemia or infarction. Palpitations. Carotid bruit. Carotid duplex 03/03/2022: No evidence of ICA stenosis bilaterally. Hypertension.  Hyperlipidemia. Graves' disease. Tobacco abuse.  In summary, patient was first evaluated by Dr. Gollan on 07/08/2021 for shortness of breath.  She was evaluated in the emergency room the day prior.  She reported lower extremity edema starting in February 2023 increasing over the prior 2 weeks.  She also reported a 1 week history of worsening shortness of breath at night positive for PND.  While in the ED she was diuresed with IV Lasix  and discharged with p.o. Lasix  and potassium.  She underwent echo which demonstrated normal LV/RV function, mild LVH/RVH as detailed above.  Patient was seen in the office on 01/18/2023 for routine follow-up.  She was euvolemic with well-controlled BP.  No medication changes were made   Patient was seen in the office on 08/16/2023 for evaluation of dyspnea.  She reported a 1 month history of increased DOE.  She stated feeling normal in the morning but noticing increased exertional dyspnea throughout the day that resolved with rest.  EKG demonstrated ST elevation similar to previous EKG in October 2024.  This was reviewed with Dr. Darron who  felt EKG not indicative of STEMI.  Coronary CTA was unable to be performed secondary to elevated heart rate.  Patient was contacted and agreed to proceed with cardiac PET stress.   Patient was last seen in the office by me on 09/28/2023 for follow up after testing. She continued to have DOE resolving with rest. BP was elevated at the time of her visit at 142/78. She reported home BP typically in the 140s/80s. Propranolol  was changed to carvedilol  and she was instructed to monitor BP at home for goal of <130/80.      History of Present Illness    Today, patient ***  Chronic diastolic heart failure/DOE Echo May 2025 demonstrated EF 60 to 65%, mild LVH/RVH, Grade I DD, normal RV function, trivial MR, mild to moderate AI.  Cardiac PET stress 09/09/2023 was a normal, low risk study with no evidence of ischemia or infarction.  Patient *** - Continue Lasix , carvedilol . - Increase physical activity as tolerated.    Hypertension BP today *** - Continue carvedilol .    Tobacco abuse Patient smokes 1/2-3/4 pack of cigarettes a day. She has quit successfully in the past. Discussed quitting strategies. - Encouraged complete cessation. ***  ROS: All other systems reviewed and are otherwise negative except as noted in History of Present Illness.  EKGs/Labs Reviewed        11/13/2023: ALT 15; AST 14; BUN 11; Creatinine, Ser 0.80; Potassium 3.0; Sodium 133   11/13/2023: Hemoglobin 13.7; WBC 8.1   No results found for requested  labs within last 365 days.   No results found for requested labs within last 365 days.  ***  Risk Assessment/Calculations    {Does this patient have ATRIAL FIBRILLATION?:[phone]} No BP recorded.  {Refresh Note OR Click here to enter BP  :1}***        Physical Exam    VS:  There were no vitals taken for this visit. , BMI There is no height or weight on file to calculate BMI.  GEN: Well nourished, well developed, in no acute distress. Neck: No JVD or carotid  bruits. Cardiac: *** RRR. *** No murmur. No rubs or gallops.   Respiratory:  Respirations regular and unlabored. Clear to auscultation without rales, wheezing or rhonchi. GI: Soft, nontender, nondistended. Extremities: Radials/DP/PT 2+ and equal bilaterally. No clubbing or cyanosis. No edema ***  Skin: Warm and dry, no rash. Neuro: Strength intact.  Assessment & Plan   ***  Disposition: ***     {Are you ordering a CV Procedure (e.g. stress test, cath, DCCV, TEE, etc)?   Press F2        :[id number]}   Signed, Barnie HERO. Amanee Iacovelli, DNP, NP-C

## 2023-12-31 ENCOUNTER — Ambulatory Visit: Admitting: Student

## 2024-01-16 IMAGING — CR DG CHEST 2V
1 series · 2 of 2 positions shown · non-contrast
Comparison: 06/19/2021 chest radiograph.

CLINICAL DATA: Chest pain and dyspnea

EXAM:
CHEST - 2 VIEW

[Series 1: dg chest 2 view · 0.14mm/px · 2 of 2 slices shown]
[im 1/2]
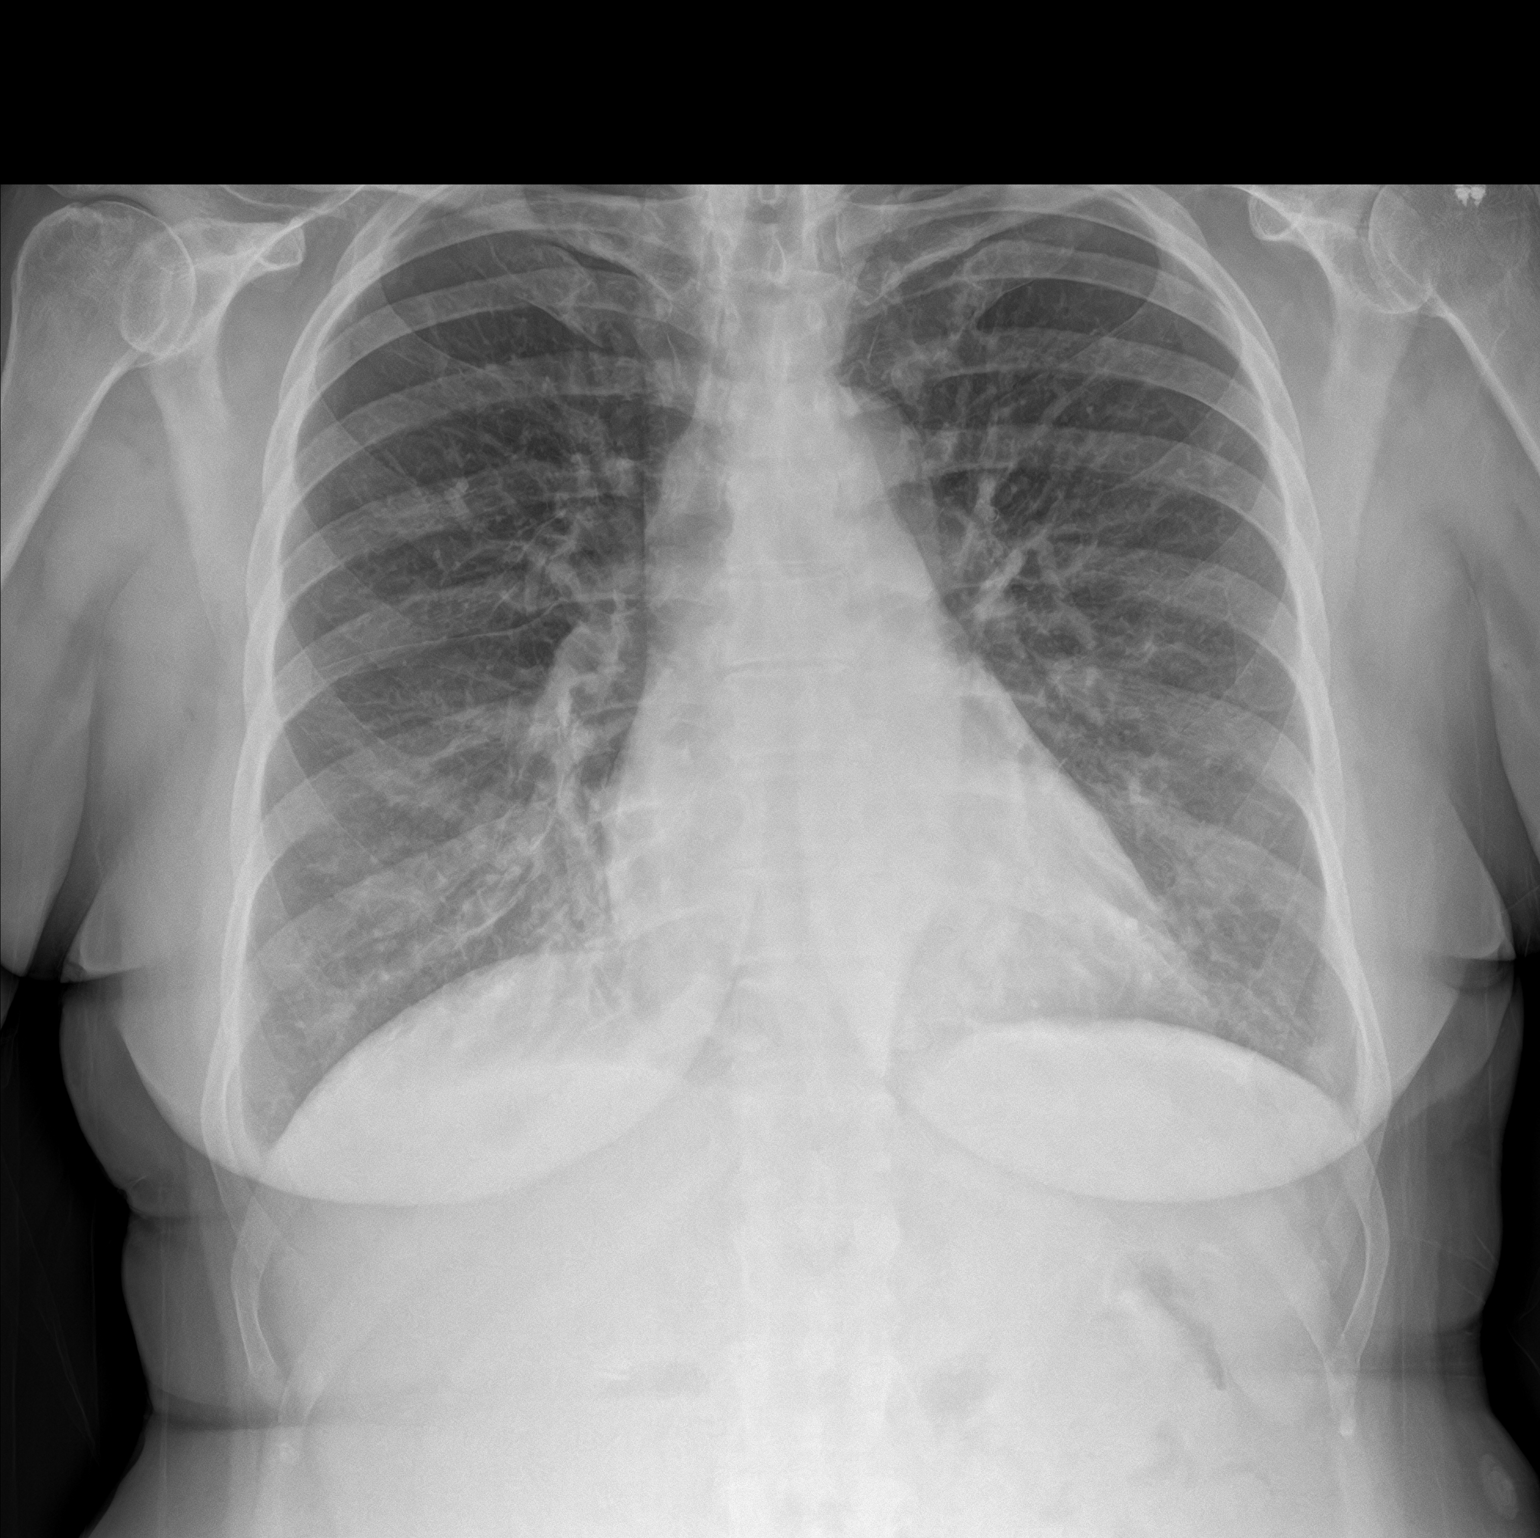
[im 2/2]
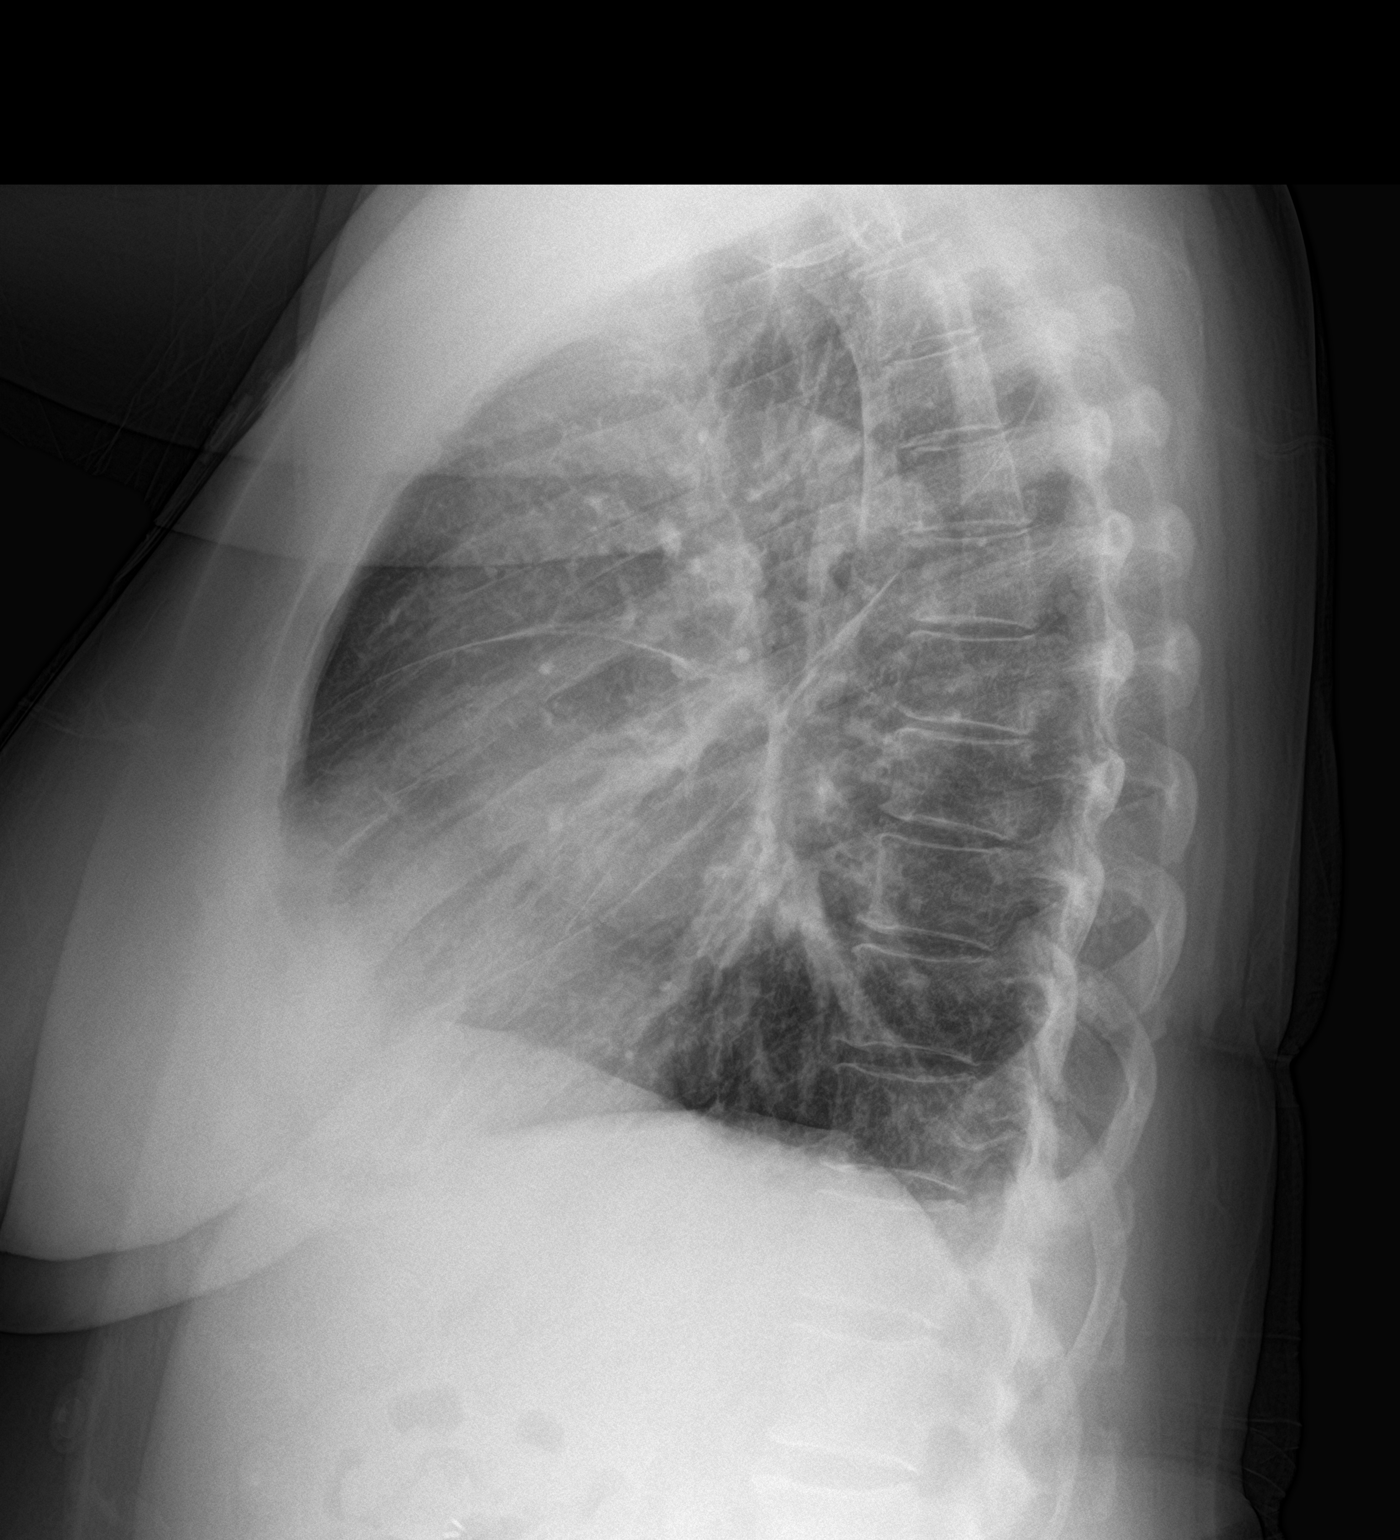

[2 of 2 positions shown; findings below may reference images not displayed]

FINDINGS: Stable cardiomediastinal silhouette with normal heart size. No
pneumothorax. No pleural effusion. Lungs appear clear, with no acute
consolidative airspace disease and no pulmonary edema.
IMPRESSION: No active cardiopulmonary disease.

## 2024-01-16 IMAGING — CT CT ANGIO CHEST
2 of 6 series · 17 of 46 positions shown · IV contrast (APPLIED)
Comparison: No priors.

CLINICAL DATA: 55-year-old female with history of shortness of
breath and chest tightness.

EXAM:
CT ANGIOGRAPHY CHEST WITH CONTRAST
TECHNIQUE: Multidetector CT imaging of the chest was performed using the
standard protocol during bolus administration of intravenous
contrast. Multiplanar CT image reconstructions and MIPs were
obtained to evaluate the vascular anatomy.

[Series 8: thins · axial · 0.80mm/px · z∈[+1250,+1496]mm · 14 of 384 slices shown]
[im 17/384  lung]
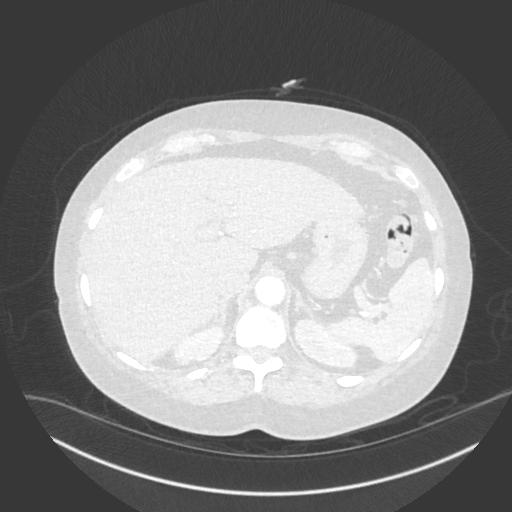
[im 50/384  soft-tissue]
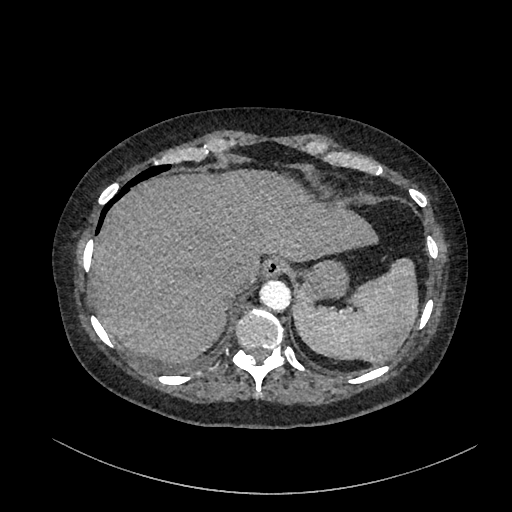
[im 67/384  lung]
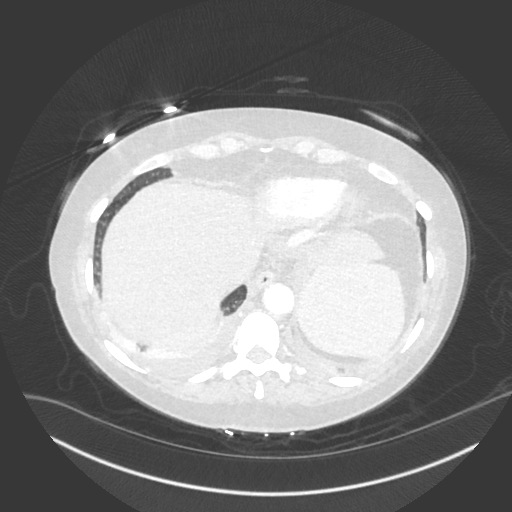
[im 100/384  soft-tissue]
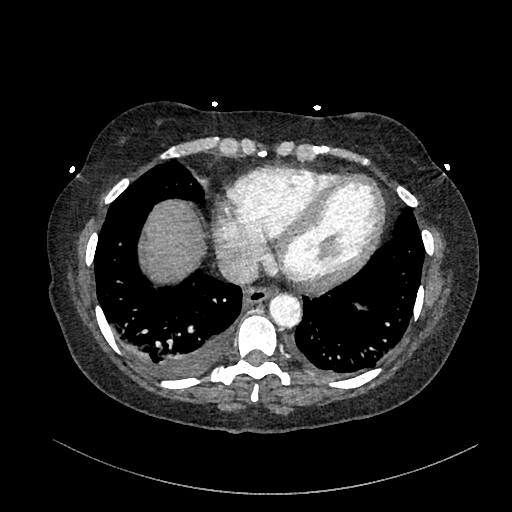
[im 134/384  lung]
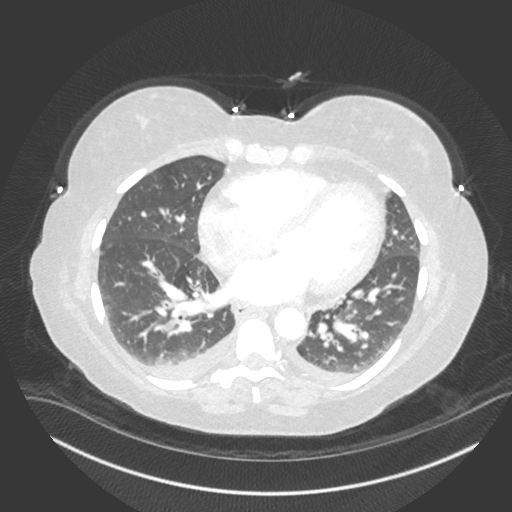
[im 150/384  soft-tissue]
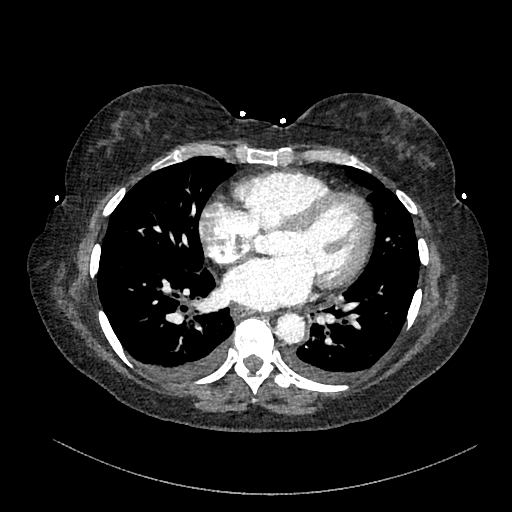
[im 184/384  lung]
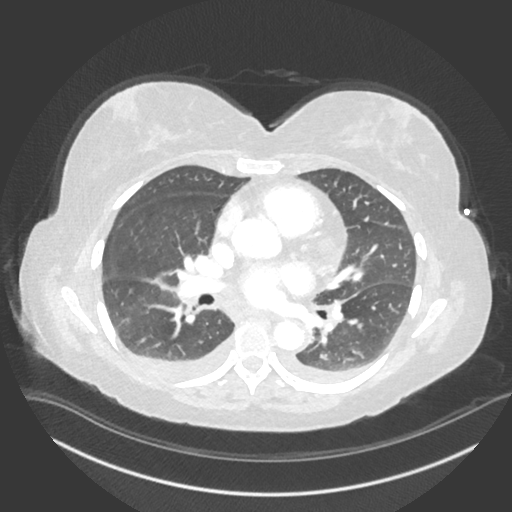
[im 200/384  soft-tissue]
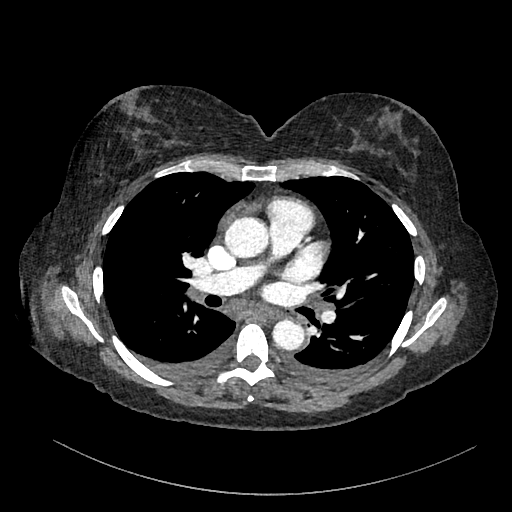
[im 234/384  lung]
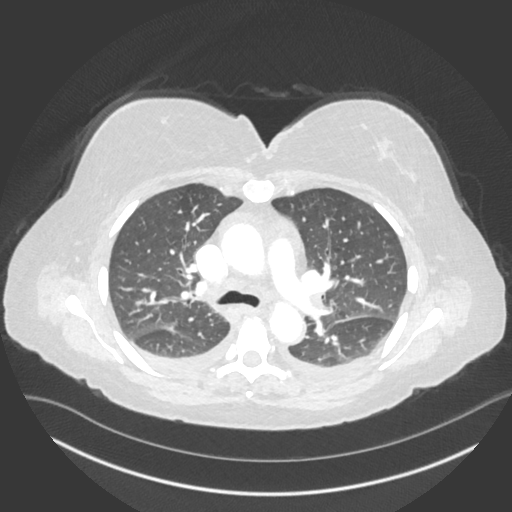
[im 250/384  soft-tissue]
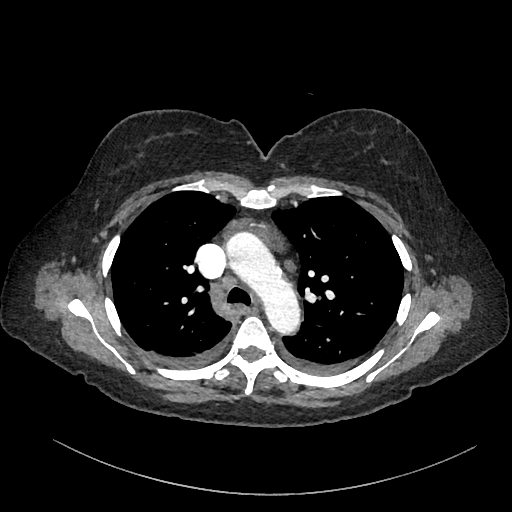
[im 284/384  lung]
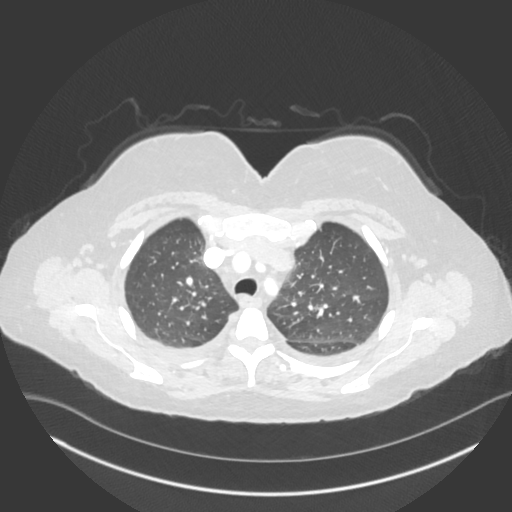
[im 317/384  soft-tissue]
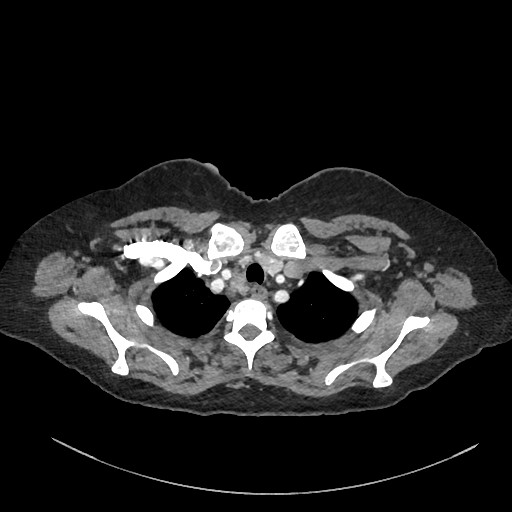
[im 334/384  lung]
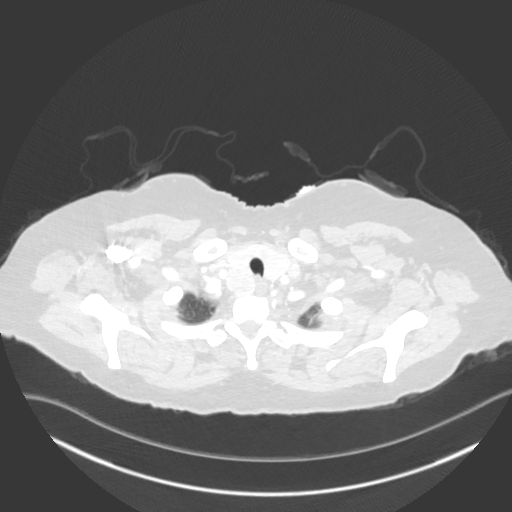
[im 367/384  soft-tissue]
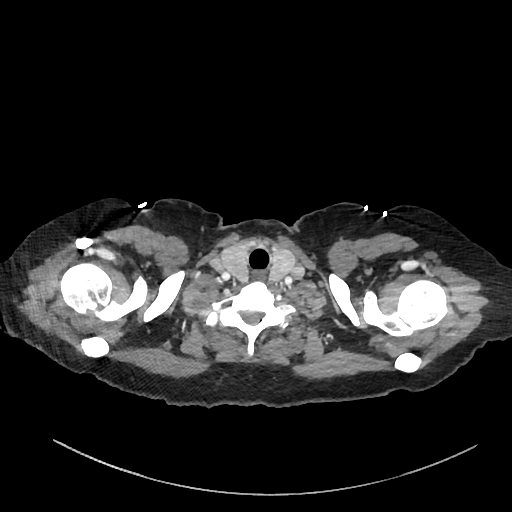

[Series 9: cor · coronal · 0.54mm/px · 3 of 142 slices shown]
[im 36/142  soft-tissue]
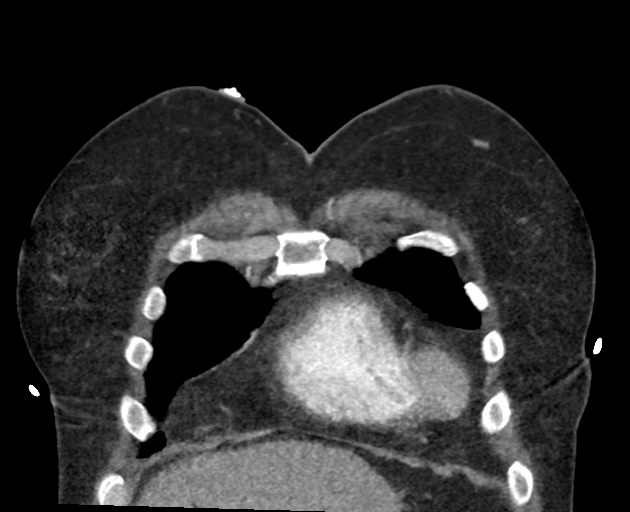
[im 71/142  soft-tissue]
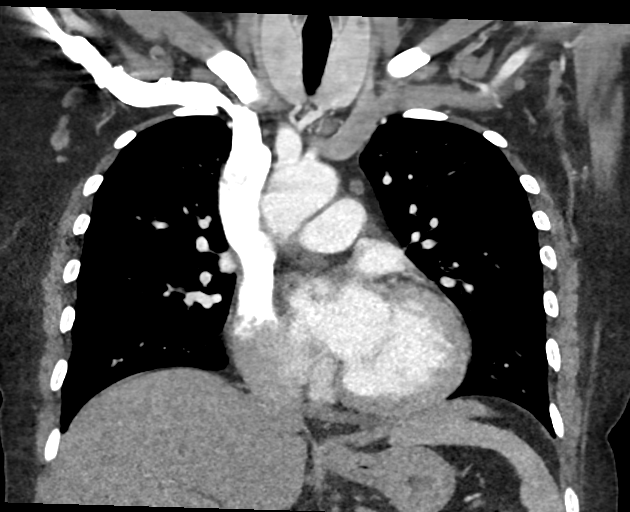
[im 106/142  soft-tissue]
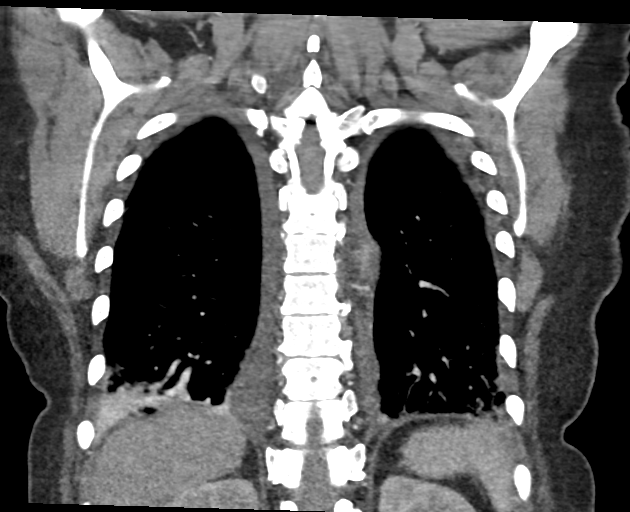

[17 of 46 positions shown; findings below may reference images not displayed]

RADIATION DOSE REDUCTION: This exam was performed according to the
departmental dose-optimization program which includes automated
exposure control, adjustment of the mA and/or kV according to
patient size and/or use of iterative reconstruction technique.

CONTRAST:  75mL OMNIPAQUE IOHEXOL 350 MG/ML SOLN
FINDINGS: Cardiovascular: No filling defects within the pulmonary arterial
tree to suggest pulmonary embolism. Heart size is borderline
enlarged. There is no significant pericardial fluid, thickening or
pericardial calcification. Aortic atherosclerosis. No definite
coronary artery calcifications.

Mediastinum/Nodes: No pathologically enlarged mediastinal or hilar
lymph nodes. Esophagus is unremarkable in appearance. Exophytic
heterogeneously enhancing nodule extending off the inferior aspect
of the right lobe of the thyroid gland measuring 2.5 x 2.1 cm. There
is some amorphous soft tissue in the anterior mediastinum with some
internal fatty stippling, favored to be of thymic origin.

Lungs/Pleura: Small bilateral pleural effusions lying dependently.
There is a background of very mild ground-glass attenuation and some
mild interlobular septal thickening noted in the lungs. No acute
consolidative airspace disease. No definite suspicious appearing
pulmonary nodules or masses are noted.

Upper Abdomen: Unremarkable.

Musculoskeletal: There are no aggressive appearing lytic or blastic
lesions noted in the visualized portions of the skeleton.

Review of the MIP images confirms the above findings.
IMPRESSION: 1. No evidence of pulmonary embolism.
2. Heart is borderline enlarged, and there is evidence of mild
interstitial pulmonary edema and small bilateral pleural effusions.
Clinical correlation for signs and symptoms of congestive heart
failure is recommended.
3. Small amount of amorphous soft tissue in the anterior mediastinum
with some internal fatty stippling. This is strongly favored to be
of thymic origin, potentially a thymoma. Clinical correlation for
signs and symptoms of myasthenia gravis is recommended.
4. 2.5 x 2.1 cm heterogeneously enhancing nodule in the right lobe
of the thyroid gland. Recommend thyroid US (ref: [HOSPITAL].
[DATE]): 143-50).

## 2024-01-17 NOTE — Progress Notes (Signed)
 Cardiology Clinic Note   Date: 01/26/2024 ID: Deanna Howard, DOB Jan 25, 1966, MRN [id number]  Primary Cardiologist:  Evalene Lunger, MD  Chief Complaint   Deanna Howard is a 58 y.o. female who presents to the clinic today for routine follow up.   Patient Profile   Deanna Howard is followed by Dr. Gollan for the history outlined below.      Past medical history significant for: Chronic diastolic heart failure. Echo 08/27/2023: EF 60 to 65%.  Regional wall motion unable to be evaluated.  Mild LVH.  Grade I DD.  Normal RV function.  Mild RVH.  Normal PA pressure.  Mild LAE.  Trivial MR.  Mild to moderate AI.  Cannot exclude small PFO. Dyspnea. Cardiac PET stress 09/09/2023: Normal, low risk study.  No evidence of ischemia or infarction. Palpitations. Carotid bruit. Carotid duplex 03/03/2022: No evidence of ICA stenosis bilaterally. Hypertension.  Hyperlipidemia. Graves' disease. Tobacco abuse.  In summary, patient was first evaluated by Dr. Gollan on 07/08/2021 for shortness of breath.  She was evaluated in the emergency room the day prior.  She reported lower extremity edema starting in February 2023 increasing over the prior 2 weeks.  She also reported a 1 week history of worsening shortness of breath at night positive for PND.  While in the ED she was diuresed with IV Lasix  and discharged with p.o. Lasix  and potassium.  She underwent echo which demonstrated normal LV/RV function, mild LVH/RVH as detailed above.  Patient was seen in the office on 01/18/2023 for routine follow-up.  She was euvolemic with well-controlled BP.  No medication changes were made   Patient was seen in the office on 08/16/2023 for evaluation of dyspnea.  She reported a 1 month history of increased DOE.  She stated feeling normal in the morning but noticing increased exertional dyspnea throughout the day that resolved with rest.  EKG demonstrated ST elevation similar to previous EKG in October 2024.  This was reviewed  with Dr. Darron who felt EKG not indicative of STEMI.  Coronary CTA was unable to be performed secondary to elevated heart rate.  Patient was contacted and agreed to proceed with cardiac PET stress.   Patient was last seen in the office by me on 09/28/2023 for follow up after testing. She continued to have DOE resolving with rest. BP was elevated at the time of her visit at 142/78. She reported home BP typically in the 140s/80s. Propranolol  was changed to carvedilol  and she was instructed to monitor BP at home for goal of <130/80.      History of Present Illness    Today, patient is doing well. Patient denies lower extremity edema, orthopnea or PND. She reports mild dyspnea at baseline. No chest pain, pressure, or tightness. No palpitations. She just came back from seeing both of her sons in [place]  who are in the [employer]. She is trying to walk more for exercise. She was not able to take carvedilol , as it caused lightheadedness. She was changed to losartan  and restarted on propranolol . Home BP in the 130s systolic.     ROS: All other systems reviewed and are otherwise negative except as noted in History of Present Illness.  EKGs/Labs Reviewed       EKG is not performed today.   11/13/2023: ALT 15; AST 14; BUN 11; Creatinine, Ser 0.80; Potassium 3.0; Sodium 133   11/13/2023: Hemoglobin 13.7; WBC 8.1    Physical Exam    VS:  BP 132/78 (BP Location: Left Arm,  Patient Position: Sitting, Cuff Size: Normal)   Pulse 67   Ht 5' 4 (1.626 m)   Wt 174 lb (78.9 kg)   SpO2 97%   BMI 29.87 kg/m  , BMI Body mass index is 29.87 kg/m.  GEN: Well nourished, well developed, in no acute distress. Neck: No JVD or carotid bruits. Cardiac:  RRR.  No murmur. No rubs or gallops.   Respiratory:  Respirations regular and unlabored. Clear to auscultation without rales, wheezing or rhonchi. GI: Soft, nontender, nondistended. Extremities: Radials/DP/PT 2+ and equal bilaterally. No clubbing or cyanosis. No  edema   Skin: Warm and dry, no rash. Neuro: Strength intact.  Assessment & Plan   Chronic diastolic heart failure/DOE Echo May 2025 demonstrated EF 60 to 65%, mild LVH/RVH, Grade I DD, normal RV function, trivial MR, mild to moderate AI.  Cardiac PET stress 09/09/2023 was a normal, low risk study with no evidence of ischemia or infarction.  Patient reports dyspnea at baseline. No lower extremity edema, orthopnea or PND. Walking more for exercise with good tolerance. Euvolemic and well compensated on exam.  - Continue Lasix , carvedilol . - Increase physical activity as tolerated.    Hypertension BP today 132/78. No headaches, dizziness or vision changes.  - Continue carvedilol .    Tobacco abuse Patient smokes 1/2-3/4 pack of cigarettes a day. She has quit successfully in the past. Discussed quitting strategies. - Encouraged complete cessation.   Disposition: Return in 1 year or sooner as needed.          Signed, Barnie HERO. Bandon Sherwin, DNP, NP-C

## 2024-01-26 ENCOUNTER — Encounter: Payer: Self-pay | Admitting: Student

## 2024-01-26 ENCOUNTER — Ambulatory Visit: Attending: Student | Admitting: Student

## 2024-01-26 VITALS — BP 132/78 | HR 67 | Ht 64.0 in | Wt 174.0 lb

## 2024-01-26 DIAGNOSIS — I5032 Chronic diastolic (congestive) heart failure: Secondary | ICD-10-CM | POA: Diagnosis not present

## 2024-01-26 DIAGNOSIS — I10 Essential (primary) hypertension: Secondary | ICD-10-CM

## 2024-01-26 DIAGNOSIS — Z720 Tobacco use: Secondary | ICD-10-CM | POA: Diagnosis not present

## 2024-01-26 DIAGNOSIS — R0609 Other forms of dyspnea: Secondary | ICD-10-CM

## 2024-01-26 NOTE — Patient Instructions (Signed)
 Medication Instructions:   Your physician recommends that you continue on your current medications as directed. Please refer to the Current Medication list given to you today.    *If you need a refill on your cardiac medications before your next appointment, please call your pharmacy*  Lab Work:  None ordered at this time   If you have labs (blood work) drawn today and your tests are completely normal, you will receive your results only by:  MyChart Message (if you have MyChart) OR  A paper copy in the mail If you have any lab test that is abnormal or we need to change your treatment, we will call you to review the results.  Testing/Procedures:  None ordered at this time   Referrals:  None ordered at this time   Follow-Up:  At [facility], you and your health needs are our priority.  As part of our continuing mission to provide you with exceptional heart care, our providers are all part of one team.  This team includes your primary Cardiologist (physician) and Advanced Practice Providers or APPs (Physician Assistants and Nurse Practitioners) who all work together to provide you with the care you need, when you need it.  Your next appointment:   1 year(s)  Provider:    Evalene Lunger, MD or Barnie Hila, NP    We recommend signing up for the patient portal called MyChart.  Sign up information is provided on this After Visit Summary.  MyChart is used to connect with patients for Virtual Visits (Telemedicine).  Patients are able to view lab/test results, encounter notes, upcoming appointments, etc.  Non-urgent messages can be sent to your provider as well.   To learn more about what you can do with MyChart, go to [email or web address].

## 2024-04-06 ENCOUNTER — Other Ambulatory Visit: Payer: Self-pay | Admitting: Physician Assistant

## 2024-04-06 DIAGNOSIS — F411 Generalized anxiety disorder: Secondary | ICD-10-CM

## 2024-05-05 ENCOUNTER — Ambulatory Visit: Admitting: Physician Assistant

## 2024-05-05 ENCOUNTER — Other Ambulatory Visit: Payer: Self-pay | Admitting: Physician Assistant

## 2024-05-05 DIAGNOSIS — F411 Generalized anxiety disorder: Secondary | ICD-10-CM

## 2024-05-11 ENCOUNTER — Ambulatory Visit: Admitting: Physician Assistant

## 2024-11-09 ENCOUNTER — Encounter: Admitting: Physician Assistant
